# Patient Record
Sex: Female | Born: 1970 | Race: White | Hispanic: No | Marital: Married | Smoking: Current every day smoker
Health system: Southern US, Community
[De-identification: ages and names within clinical notes are randomized; demographics above are authoritative.]

## PROBLEM LIST (undated history)

## (undated) DIAGNOSIS — M5126 Other intervertebral disc displacement, lumbar region: Secondary | ICD-10-CM

## (undated) DIAGNOSIS — F419 Anxiety disorder, unspecified: Secondary | ICD-10-CM

## (undated) DIAGNOSIS — J439 Emphysema, unspecified: Secondary | ICD-10-CM

## (undated) DIAGNOSIS — M5136 Other intervertebral disc degeneration, lumbar region: Secondary | ICD-10-CM

## (undated) DIAGNOSIS — M51369 Other intervertebral disc degeneration, lumbar region without mention of lumbar back pain or lower extremity pain: Secondary | ICD-10-CM

## (undated) DIAGNOSIS — M1990 Unspecified osteoarthritis, unspecified site: Secondary | ICD-10-CM

## (undated) DIAGNOSIS — M797 Fibromyalgia: Secondary | ICD-10-CM

## (undated) DIAGNOSIS — M810 Age-related osteoporosis without current pathological fracture: Secondary | ICD-10-CM

## (undated) HISTORY — PX: ABDOMINAL HYSTERECTOMY: SHX81

---

## 2003-11-05 ENCOUNTER — Other Ambulatory Visit: Payer: Self-pay

## 2004-03-15 ENCOUNTER — Emergency Department: Payer: Self-pay | Admitting: Emergency Medicine

## 2004-06-30 ENCOUNTER — Other Ambulatory Visit: Payer: Self-pay

## 2004-06-30 ENCOUNTER — Emergency Department: Payer: Self-pay | Admitting: Unknown Physician Specialty

## 2005-02-16 ENCOUNTER — Emergency Department: Payer: Self-pay | Admitting: Emergency Medicine

## 2006-03-12 ENCOUNTER — Other Ambulatory Visit: Payer: Self-pay

## 2006-03-12 ENCOUNTER — Emergency Department: Payer: Self-pay | Admitting: Emergency Medicine

## 2006-07-13 ENCOUNTER — Other Ambulatory Visit: Payer: Self-pay

## 2006-07-13 ENCOUNTER — Emergency Department: Payer: Self-pay | Admitting: Emergency Medicine

## 2006-07-17 ENCOUNTER — Emergency Department: Payer: Self-pay | Admitting: Unknown Physician Specialty

## 2006-07-17 ENCOUNTER — Other Ambulatory Visit: Payer: Self-pay

## 2007-03-06 ENCOUNTER — Emergency Department: Payer: Self-pay | Admitting: Unknown Physician Specialty

## 2007-04-13 ENCOUNTER — Emergency Department: Payer: Self-pay | Admitting: Emergency Medicine

## 2007-12-20 ENCOUNTER — Other Ambulatory Visit: Payer: Self-pay

## 2007-12-20 ENCOUNTER — Emergency Department: Payer: Self-pay | Admitting: Internal Medicine

## 2010-09-15 ENCOUNTER — Emergency Department: Payer: Self-pay | Admitting: Emergency Medicine

## 2012-07-07 ENCOUNTER — Emergency Department: Payer: Self-pay | Admitting: Emergency Medicine

## 2012-07-07 LAB — CBC WITH DIFFERENTIAL/PLATELET
Basophil #: 0.1 10*3/uL (ref 0.0–0.1)
Eosinophil #: 0.1 10*3/uL (ref 0.0–0.7)
Lymphocyte %: 31.8 %
MCH: 30.6 pg (ref 26.0–34.0)
MCV: 90 fL (ref 80–100)
Monocyte %: 5.7 %
Neutrophil #: 3.9 10*3/uL (ref 1.4–6.5)
Neutrophil %: 59.9 %
Platelet: 187 10*3/uL (ref 150–440)
RBC: 4.46 10*6/uL (ref 3.80–5.20)
RDW: 14.3 % (ref 11.5–14.5)
WBC: 6.4 10*3/uL (ref 3.6–11.0)

## 2013-02-16 ENCOUNTER — Emergency Department: Payer: Self-pay | Admitting: Internal Medicine

## 2013-02-18 ENCOUNTER — Emergency Department: Payer: Self-pay | Admitting: Emergency Medicine

## 2013-05-28 ENCOUNTER — Emergency Department: Payer: Self-pay | Admitting: Emergency Medicine

## 2013-05-28 LAB — COMPREHENSIVE METABOLIC PANEL
Albumin: 3.7 g/dL (ref 3.4–5.0)
Alkaline Phosphatase: 176 U/L — ABNORMAL HIGH
Anion Gap: 7 (ref 7–16)
BUN: 9 mg/dL (ref 7–18)
Bilirubin,Total: 0.3 mg/dL (ref 0.2–1.0)
CHLORIDE: 106 mmol/L (ref 98–107)
CO2: 26 mmol/L (ref 21–32)
Calcium, Total: 9 mg/dL (ref 8.5–10.1)
Creatinine: 0.82 mg/dL (ref 0.60–1.30)
EGFR (African American): >60
GLUCOSE: 120 mg/dL — AB (ref 65–99)
Osmolality: 277 (ref 275–301)
POTASSIUM: 3.6 mmol/L (ref 3.5–5.1)
SGOT(AST): 69 U/L — ABNORMAL HIGH (ref 15–37)
SGPT (ALT): 97 U/L — ABNORMAL HIGH (ref 12–78)
Sodium: 139 mmol/L (ref 136–145)
TOTAL PROTEIN: 7.4 g/dL (ref 6.4–8.2)

## 2013-05-28 LAB — TROPONIN I: Troponin-I: <0.02 ng/mL

## 2013-05-28 LAB — CBC
HCT: 38.2 % (ref 35.0–47.0)
HGB: 13 g/dL (ref 12.0–16.0)
MCH: 31.9 pg (ref 26.0–34.0)
MCHC: 34.1 g/dL (ref 32.0–36.0)
MCV: 94 fL (ref 80–100)
PLATELETS: 172 10*3/uL (ref 150–440)
RBC: 4.09 10*6/uL (ref 3.80–5.20)
RDW: 13.6 % (ref 11.5–14.5)
WBC: 6.5 10*3/uL (ref 3.6–11.0)

## 2015-01-01 ENCOUNTER — Encounter: Payer: Self-pay | Admitting: Emergency Medicine

## 2015-01-01 DIAGNOSIS — Z720 Tobacco use: Secondary | ICD-10-CM | POA: Diagnosis not present

## 2015-01-01 DIAGNOSIS — Y848 Other medical procedures as the cause of abnormal reaction of the patient, or of later complication, without mention of misadventure at the time of the procedure: Secondary | ICD-10-CM | POA: Insufficient documentation

## 2015-01-01 DIAGNOSIS — M79601 Pain in right arm: Secondary | ICD-10-CM | POA: Diagnosis present

## 2015-01-01 DIAGNOSIS — L7622 Postprocedural hemorrhage and hematoma of skin and subcutaneous tissue following other procedure: Secondary | ICD-10-CM | POA: Insufficient documentation

## 2015-01-01 NOTE — ED Notes (Signed)
Pt presents to ED after she was discharged from [facility] in [place] today at 11am. Pt states while she was in the hospital they gave her iv phenergan that was very painful while being infused X2 during the night. This morning the next shift nurse told the pt that the IV "needs to come out" and was no longer in the vein. IV was removed shortly after that. Pt states her right arm has remained painful and this evening states it appeared to be bruising along her upper arm. No obvious swelling or redness noted to affected arm at this time. Pt slightly anxious and emotional.

## 2015-01-02 ENCOUNTER — Emergency Department
Admission: EM | Admit: 2015-01-02 | Discharge: 2015-01-02 | Disposition: A | Discharge status: Home / Self Care | Payer: Medicare Other | Attending: Emergency Medicine | Admitting: Emergency Medicine

## 2015-01-02 DIAGNOSIS — R58 Hemorrhage, not elsewhere classified: Secondary | ICD-10-CM

## 2015-01-02 NOTE — Discharge Instructions (Signed)
Please seek medical attention for any high fevers, chest pain, shortness of breath, change in behavior, persistent vomiting, bloody stool or any other new or concerning symptoms.  RICE: Routine Care for Injuries The routine care of many injuries includes Rest, Ice, Compression, and Elevation (RICE). HOME CARE INSTRUCTIONS  Rest is needed to allow your body to heal. Routine activities can usually be resumed when comfortable.  Ice following an injury helps keep the swelling down and reduces pain.  Put ice in a plastic bag.  Place a towel between your skin and the bag.  Leave the ice on for 15-20 minutes, 3-4 times a day, or as directed by your health care provider. Do this while awake, for the first 24 to 48 hours. After that, continue as directed by your caregiver.  Compression helps keep swelling down. It also gives support and helps with discomfort. If an elastic bandage has been applied, it should be removed and reapplied every 3 to 4 hours. It should not be applied tightly, but firmly enough to keep swelling down. Watch fingers or toes for swelling, bluish discoloration, coldness, numbness, or excessive pain. If any of these problems occur, remove the bandage and reapply loosely. Contact your caregiver if these problems continue.  Elevation helps reduce swelling and decreases pain. With extremities, such as the arms, hands, legs, and feet, the injured area should be placed near or above the level of the heart, if possible. SEEK IMMEDIATE MEDICAL CARE IF:  You have persistent pain and swelling.  You develop redness, numbness, or unexpected weakness.  Your symptoms are getting worse rather than improving after several days. These symptoms may indicate that further evaluation or further X-rays are needed. Sometimes, X-rays may not show a small broken bone (fracture) until 1 week or 10 days later. Make a follow-up appointment with your caregiver. Ask when your X-ray results will be ready.  Make sure you get your X-ray results. Document Released: 07/24/2000 Document Revised: 04/16/2013 Document Reviewed: 09/10/2010 [facility] Patient Information 2015 [place], [place]. This information is not intended to replace advice given to you by your health care provider. Make sure you discuss any questions you have with your health care provider.

## 2015-01-02 NOTE — ED Provider Notes (Signed)
[facility] Emergency Department Provider Note    ____________________________________________  Time seen: 79  I have reviewed the triage vital signs and the nursing notes.   HISTORY  Chief Complaint Arm Pain   History limited by: Not Limited   HPI Tracie Mcdaniel is a 44 y.o. female who presents to the emergency department today with concerns for right arm pain. Patient states that she had an IV placed 2 days ago at [facility]. She states that she received 2 doses of IV Phenergan with that IV. She did have a concern at the time of administration that it was not in the vein. She states that she felt swelling and bubbling at the site of the IV when she was getting her medications. She stated that one of the nurses then told her it was not in and pulled it out. She states that she has noticed some bruising, swelling and pain to that area since then.     History reviewed. No pertinent past medical history.  There are no active problems to display for this patient.   History reviewed. No pertinent past surgical history.  No current outpatient prescriptions on file.  Allergies Review of patient's allergies indicates no known allergies.  History reviewed. No pertinent family history.  Social History Social History  Substance Use Topics  . Smoking status: Current Every Day Smoker -- 0.25 packs/day    Types: Cigarettes  . Smokeless tobacco: None  . Alcohol Use: No    Review of Systems  Constitutional: Negative for fever. Cardiovascular: Negative for chest pain. Respiratory: Negative for shortness of breath. Gastrointestinal: Negative for abdominal pain, vomiting and diarrhea. Genitourinary: Negative for dysuria. Musculoskeletal: Negative for back pain. Skin: Bruising to the right AC fossa Neurological: Negative for headaches, focal weakness or numbness.  10-point ROS otherwise  negative.  ____________________________________________   PHYSICAL EXAM:  VITAL SIGNS: ED Triage Vitals  Enc Vitals Group     BP 01/01/15 2256 130/87 mmHg     Pulse Rate 01/01/15 2256 85     Resp 01/01/15 2256 20     Temp 01/01/15 2256 98.3 F (36.8 C)     Temp Source 01/01/15 2256 Oral     SpO2 01/01/15 2256 97 %     Weight 01/01/15 2256 128 lb (58.06 kg)     Height 01/01/15 2256  (1.626 m)     Head Cir --      Peak Flow --      Pain Score 01/01/15 2259 8   Constitutional: Alert and oriented. Well appearing and in no distress. Eyes: Conjunctivae are normal. PERRL. Normal extraocular movements. ENT   Head: Normocephalic and atraumatic.   Nose: No congestion/rhinnorhea.   Mouth/Throat: Mucous membranes are moist.   Neck: No stridor. Hematological/Lymphatic/Immunilogical: No cervical lymphadenopathy. Cardiovascular: Normal rate, regular rhythm.  No murmurs, rubs, or gallops. Respiratory: Normal respiratory effort without tachypnea nor retractions. Breath sounds are clear and equal bilaterally. No wheezes/rales/rhonchi. Gastrointestinal: Soft and nontender. No distention.  Genitourinary: Deferred Musculoskeletal: Normal range of motion in all extremities. No joint effusions.  No lower extremity tenderness nor edema. Neurologic:  Normal speech and language. No gross focal neurologic deficits are appreciated. Speech is normal.  Skin:  Skin is warm, dry and intact. Small area of ecchymosis to the right antecubital fossa. Compartments of the upper and lower arm are soft. Psychiatric: Mood and affect are normal. Speech and behavior are normal. Patient exhibits appropriate insight and judgment.  ____________________________________________  LABS (pertinent positives/negatives)  None  ____________________________________________   EKG  None  ____________________________________________     RADIOLOGY  None  ____________________________________________   PROCEDURES  Procedure(s) performed: None  Critical Care performed: No  ____________________________________________   INITIAL IMPRESSION / ASSESSMENT AND PLAN / ED COURSE  Pertinent labs & imaging results that were available during my care of the patient were reviewed by me and considered in my medical decision making (see chart for details).  Patient presents to the emergency department today with concerns for right arm pain and swelling at site of recent IV. On exam all compartments are soft there is some ecchymosis around the area of the IV. Did have a discussion with the patient that even if medication had infiltrated there is nothing that we could do at this point. Did discuss that the compartments were soft into about this is a return precaution.  ____________________________________________   FINAL CLINICAL IMPRESSION(S) / ED DIAGNOSES  Final diagnoses:  Ecchymosis     Phineas Semen, MD 01/02/15 0430

## 2015-01-02 NOTE — ED Notes (Signed)

## 2015-01-02 NOTE — ED Notes (Signed)
Patient present to ED with c/o right upper arm pain, was seen at [facility] and d/c on 01/01/15, reports was given IV phenergan on 12/31/14 and reports pain during administrations of medicine. States "the nurse that replaced her was going to give me phenergan and when he unwrapped it he said 'Oh no this has to come out now.' and he took it out." Bruising noted to IV site on right antecubital and left antecubital . Patient reports tenderness to right bicep. No swelling or redness noted. Patient alert and oriented x 4, respirations even and unlabored, skin warm and dry.

## 2015-08-28 ENCOUNTER — Emergency Department
Admission: EM | Admit: 2015-08-28 | Discharge: 2015-08-28 | Disposition: A | Discharge status: Home / Self Care | Payer: Medicare Other | Attending: Emergency Medicine | Admitting: Emergency Medicine

## 2015-08-28 ENCOUNTER — Encounter: Payer: Self-pay | Admitting: *Deleted

## 2015-08-28 DIAGNOSIS — R21 Rash and other nonspecific skin eruption: Secondary | ICD-10-CM

## 2015-08-28 DIAGNOSIS — F17210 Nicotine dependence, cigarettes, uncomplicated: Secondary | ICD-10-CM | POA: Diagnosis not present

## 2015-08-28 DIAGNOSIS — M1990 Unspecified osteoarthritis, unspecified site: Secondary | ICD-10-CM | POA: Diagnosis not present

## 2015-08-28 HISTORY — DX: Emphysema, unspecified: J43.9

## 2015-08-28 HISTORY — DX: Other intervertebral disc degeneration, lumbar region: M51.36

## 2015-08-28 HISTORY — DX: Other intervertebral disc degeneration, lumbar region without mention of lumbar back pain or lower extremity pain: M51.369

## 2015-08-28 HISTORY — DX: Anxiety disorder, unspecified: F41.9

## 2015-08-28 HISTORY — DX: Unspecified osteoarthritis, unspecified site: M19.90

## 2015-08-28 HISTORY — DX: Other intervertebral disc displacement, lumbar region: M51.26

## 2015-08-28 LAB — CBC WITH DIFFERENTIAL/PLATELET
BASOS PCT: 1 %
Basophils Absolute: 0.1 10*3/uL (ref 0–0.1)
Eosinophils Absolute: 0.1 10*3/uL (ref 0–0.7)
Eosinophils Relative: 2 %
HEMATOCRIT: 38.5 % (ref 35.0–47.0)
HEMOGLOBIN: 13.2 g/dL (ref 12.0–16.0)
LYMPHS ABS: 2.3 10*3/uL (ref 1.0–3.6)
Lymphocytes Relative: 32 %
MCH: 31.6 pg (ref 26.0–34.0)
MCHC: 34.4 g/dL (ref 32.0–36.0)
MCV: 91.8 fL (ref 80.0–100.0)
MONOS PCT: 6 %
Monocytes Absolute: 0.4 10*3/uL (ref 0.2–0.9)
NEUTROS ABS: 4.3 10*3/uL (ref 1.4–6.5)
NEUTROS PCT: 59 %
Platelets: 177 10*3/uL (ref 150–440)
RBC: 4.19 MIL/uL (ref 3.80–5.20)
RDW: 12.9 % (ref 11.5–14.5)
WBC: 7.2 10*3/uL (ref 3.6–11.0)

## 2015-08-28 LAB — COMPREHENSIVE METABOLIC PANEL
ALK PHOS: 86 U/L (ref 38–126)
ALT: 17 U/L (ref 14–54)
ANION GAP: 7 (ref 5–15)
AST: 21 U/L (ref 15–41)
Albumin: 3.9 g/dL (ref 3.5–5.0)
BUN: 11 mg/dL (ref 6–20)
CALCIUM: 8.9 mg/dL (ref 8.9–10.3)
CO2: 24 mmol/L (ref 22–32)
Chloride: 106 mmol/L (ref 101–111)
Creatinine, Ser: 0.86 mg/dL (ref 0.44–1.00)
GFR calc non Af Amer: >60 mL/min (ref >60)
Glucose, Bld: 107 mg/dL — ABNORMAL HIGH (ref 65–99)
POTASSIUM: 3.8 mmol/L (ref 3.5–5.1)
SODIUM: 137 mmol/L (ref 135–145)
TOTAL PROTEIN: 7 g/dL (ref 6.5–8.1)
Total Bilirubin: 0.4 mg/dL (ref 0.3–1.2)

## 2015-08-28 MED ORDER — TRAMADOL HCL 50 MG PO TABS: 50.0000 mg | ORAL_TABLET | Freq: Four times a day (QID) | ORAL | Status: DC | PRN

## 2015-08-28 MED ORDER — TRIAMCINOLONE ACETONIDE 0.5 % EX OINT: 1.0000 "application " | TOPICAL_OINTMENT | Freq: Two times a day (BID) | CUTANEOUS | Status: DC

## 2015-08-28 MED ORDER — TRAMADOL HCL 50 MG PO TABS
50.0000 mg | ORAL_TABLET | Freq: Once | ORAL | Status: AC
  Administered 2015-08-28: 50 mg via ORAL
  Filled 2015-08-28: qty 1

## 2015-08-28 NOTE — ED Notes (Signed)
Pt reports a rash beginning on the inner area of both feet this morning at 1000. Pt reports noticing a knot in the right thigh and is reporting bilateral leg pains beginning this morning. Pt also reports having nausea yesterday but denies vomiting or fevers. Pt reports the rash is not itching. No heat noted to the area. Pt able to walk with no difficulty in triage.

## 2015-08-28 NOTE — ED Notes (Signed)
Pt states she took a [employer].  Patient states she has chronic foot pain and noticed it more yesterday.  Today before she was going tanning she noticed red rash to lower legs and medial feet.  Patient states the pain is increased when ambulating.  Denies any itching.

## 2015-08-28 NOTE — ED Provider Notes (Signed)
[facility] Emergency Department Provider Note  ____________________________________________  Time seen: Approximately 4:28 PM  I have reviewed the triage vital signs and the nursing notes.   HISTORY  Chief Complaint Rash   HPI Tracie Mcdaniel is a 45 y.o. female who presents to the emergency department for evaluation of a rash to bilateral lower extremities. She noticed the rash around 10:00 this morning. She states that she also noticed a "knot" in the right upper thigh that has not been there. It is tender, but not swollen or visible. She states that her feet have also been hurting much worse than usual today. She has been going to the tanning bed daily for the last 3 days, but has not used any new tanning products. She denies ever having a rash similar to this one.   Past Medical History  Diagnosis Date  . Osteoarthritis   . Bulging lumbar disc     x3  . Emphysema of lung (HCC)   . Anxiety     There are no active problems to display for this patient.   Past Surgical History  Procedure Laterality Date  . Abdominal hysterectomy      Current Outpatient Rx  Name  Route  Sig  Dispense  Refill  . traMADol (ULTRAM) 50 MG tablet   Oral   Take 1 tablet (50 mg total) by mouth every 6 (six) hours as needed.   9 tablet   0   . triamcinolone ointment (KENALOG) 0.5 %   Topical   Apply 1 application topically 2 (two) times daily.   30 g   0     Allergies Review of patient's allergies indicates no known allergies.  History reviewed. No pertinent family history.  Social History Social History  Substance Use Topics  . Smoking status: Current Every Day Smoker -- 0.50 packs/day    Types: Cigarettes  . Smokeless tobacco: None  . Alcohol Use: No    Review of Systems  Constitutional: Negative for fever/chills Respiratory: Negative for shortness of breath. Musculoskeletal: Positive for pain. Skin: Positive for rash on bilateral lower  extremities. Neurological: Negative for headaches, focal weakness or numbness. ____________________________________________   PHYSICAL EXAM:  VITAL SIGNS: ED Triage Vitals  Enc Vitals Group     BP 08/28/15 1520 131/80 mmHg     Pulse Rate 08/28/15 1520 90     Resp 08/28/15 1520 18     Temp 08/28/15 1520 98.3 F (36.8 C)     Temp Source 08/28/15 1520 Oral     SpO2 08/28/15 1520 99 %     Weight 08/28/15 1520 130 lb (58.968 kg)     Height 08/28/15 1520 5\' 2"  (1.575 m)     Head Cir --      Peak Flow --      Pain Score 08/28/15 1518 6     Pain Loc --      Pain Edu? --      Excl. in GC? --      Constitutional: Alert and oriented. Well appearing and in no acute distress. Eyes: Conjunctivae are normal. PERRL. EOMI. Nose: No congestion/rhinnorhea. Mouth/Throat: Mucous membranes are moist.   Neck: No stridor. Lymphatic: No cervical lymphadenopathy. Mobile nodule palpated in the anterior, medial thigh without associated erythema or fluctuance. Cardiovascular: Good peripheral circulation. Respiratory: Normal respiratory effort.  No retractions.. Musculoskeletal: FROM throughout. Neurologic:  Normal speech and language. No gross focal neurologic deficits are appreciated. Skin:  Confluent, petechial rash noted to bilateral  medial feet and scattered, singular maculopapular rash to bilateral medial calves and just above knees  ____________________________________________   LABS (all labs ordered are listed, but only abnormal results are displayed)  Labs Reviewed  COMPREHENSIVE METABOLIC PANEL - Abnormal; Notable for the following:    Glucose, Bld 107 (*)    All other components within normal limits  CBC WITH DIFFERENTIAL/PLATELET   ____________________________________________  EKG   ____________________________________________  RADIOLOGY  Not indicated. ____________________________________________   PROCEDURES  Procedure(s) performed:   ____________________________________________   INITIAL IMPRESSION / ASSESSMENT AND PLAN / ED COURSE  Pertinent labs & imaging results that were available during my care of the patient were reviewed by me and considered in my medical decision making (see chart for details).  Patient will be advised to use the triamcinolone twice per day.  She was advised to follow up with dermatology for symptoms that do not improve over the week. She was advised to avoid the tanning bed until the rash has cleared. She was also advised to return to the emergency department for symptoms that change or worsen if unable to schedule an appointment.   ____________________________________________   FINAL CLINICAL IMPRESSION(S) / ED DIAGNOSES  Final diagnoses:  Rash and nonspecific skin eruption       Chinita Pester, FNP 08/28/15 1724  Minna Antis, MD 08/28/15 2004

## 2015-08-31 ENCOUNTER — Emergency Department: Payer: Medicare Other

## 2015-08-31 ENCOUNTER — Emergency Department
Admission: EM | Admit: 2015-08-31 | Discharge: 2015-08-31 | Disposition: A | Discharge status: Home / Self Care | Payer: Medicare Other | Attending: Emergency Medicine | Admitting: Emergency Medicine

## 2015-08-31 ENCOUNTER — Encounter: Payer: Self-pay | Admitting: Emergency Medicine

## 2015-08-31 DIAGNOSIS — R0789 Other chest pain: Secondary | ICD-10-CM | POA: Diagnosis not present

## 2015-08-31 DIAGNOSIS — F17210 Nicotine dependence, cigarettes, uncomplicated: Secondary | ICD-10-CM | POA: Insufficient documentation

## 2015-08-31 DIAGNOSIS — Z79899 Other long term (current) drug therapy: Secondary | ICD-10-CM | POA: Diagnosis not present

## 2015-08-31 DIAGNOSIS — R079 Chest pain, unspecified: Secondary | ICD-10-CM

## 2015-08-31 LAB — CBC WITH DIFFERENTIAL/PLATELET
Basophils Absolute: 0.1 10*3/uL (ref 0–0.1)
Basophils Relative: 1 %
EOS ABS: 0.1 10*3/uL (ref 0–0.7)
HCT: 41.5 % (ref 35.0–47.0)
Hemoglobin: 14.4 g/dL (ref 12.0–16.0)
LYMPHS ABS: 2.2 10*3/uL (ref 1.0–3.6)
Lymphocytes Relative: 25 %
MCH: 31.7 pg (ref 26.0–34.0)
MCHC: 34.8 g/dL (ref 32.0–36.0)
MCV: 90.9 fL (ref 80.0–100.0)
MONO ABS: 0.4 10*3/uL (ref 0.2–0.9)
Neutro Abs: 6.3 10*3/uL (ref 1.4–6.5)
Neutrophils Relative %: 68 %
PLATELETS: 185 10*3/uL (ref 150–440)
RBC: 4.56 MIL/uL (ref 3.80–5.20)
RDW: 12.9 % (ref 11.5–14.5)
WBC: 9.1 10*3/uL (ref 3.6–11.0)

## 2015-08-31 LAB — BASIC METABOLIC PANEL
Anion gap: 12 (ref 5–15)
BUN: 12 mg/dL (ref 6–20)
CALCIUM: 9.1 mg/dL (ref 8.9–10.3)
CO2: 21 mmol/L — AB (ref 22–32)
CREATININE: 0.78 mg/dL (ref 0.44–1.00)
Chloride: 106 mmol/L (ref 101–111)
GFR calc non Af Amer: >60 mL/min (ref >60)
GLUCOSE: 103 mg/dL — AB (ref 65–99)
Potassium: 3.8 mmol/L (ref 3.5–5.1)
Sodium: 139 mmol/L (ref 135–145)

## 2015-08-31 LAB — TROPONIN I: Troponin I: <0.03 ng/mL (ref <0.031)

## 2015-08-31 MED ORDER — ONDANSETRON HCL 4 MG/2ML IJ SOLN
4.0000 mg | Freq: Once | INTRAMUSCULAR | Status: AC
  Administered 2015-08-31: 4 mg via INTRAVENOUS
  Filled 2015-08-31: qty 2

## 2015-08-31 MED ORDER — ASPIRIN 81 MG PO CHEW
324.0000 mg | CHEWABLE_TABLET | Freq: Once | ORAL | Status: AC
  Administered 2015-08-31: 324 mg via ORAL
  Filled 2015-08-31: qty 4

## 2015-08-31 MED ORDER — MORPHINE SULFATE (PF) 4 MG/ML IV SOLN
INTRAVENOUS | Status: DC
Start: 2015-08-31 — End: 2015-08-31
  Filled 2015-08-31: qty 1

## 2015-08-31 MED ORDER — LORAZEPAM 2 MG/ML IJ SOLN
1.0000 mg | Freq: Once | INTRAMUSCULAR | Status: AC
  Administered 2015-08-31: 1 mg via INTRAVENOUS
  Filled 2015-08-31: qty 1

## 2015-08-31 MED ORDER — IOPAMIDOL (ISOVUE-370) INJECTION 76%
75.0000 mL | Freq: Once | INTRAVENOUS | Status: AC | PRN
  Administered 2015-08-31: 75 mL via INTRAVENOUS

## 2015-08-31 MED ORDER — MORPHINE SULFATE (PF) 4 MG/ML IV SOLN
4.0000 mg | Freq: Once | INTRAVENOUS | Status: AC
  Administered 2015-08-31: 4 mg via INTRAVENOUS

## 2015-08-31 MED ORDER — SODIUM CHLORIDE 0.9 % IV BOLUS (SEPSIS)
1000.0000 mL | Freq: Once | INTRAVENOUS | Status: AC
  Administered 2015-08-31: 1000 mL via INTRAVENOUS

## 2015-08-31 MED ORDER — NITROGLYCERIN 0.4 MG SL SUBL
0.4000 mg | SUBLINGUAL_TABLET | SUBLINGUAL | Status: DC | PRN
  Administered 2015-08-31: 0.4 mg via SUBLINGUAL
  Filled 2015-08-31: qty 1

## 2015-08-31 MED ORDER — MORPHINE SULFATE (PF) 4 MG/ML IV SOLN
4.0000 mg | Freq: Once | INTRAVENOUS | Status: AC
  Administered 2015-08-31: 4 mg via INTRAVENOUS
  Filled 2015-08-31: qty 1

## 2015-08-31 NOTE — ED Notes (Signed)
Pt presents with chest pain since this morning with increased sob and dizziness, nausea. Pt denies prior chest pain. Describes pain as pressure, with sharp pains to left arm

## 2015-08-31 NOTE — ED Notes (Signed)
Pt in hallway in crying. Pt disconnected self from monitor and oxygen. Explained delay to pt and informed her that Dr. Shaune PollackLord will be in to talk with her as soon as possible.

## 2015-08-31 NOTE — Discharge Instructions (Signed)
You were evaluated for chest pain, and although no certain cause was found, your exam and evaluation are reassuring in the emergency department today.  We discussed, follow-up with cardiologist, call today or tomorrow to make an appointment in the next 1-2 days.  Return to the emergency department for any new or worsening condition including new or worsening chest pain, sweats, vomiting blood, black or bloody stools, dizziness, passing out, trouble breathing, or any other symptoms concerning to you.   Nonspecific Chest Pain It is often hard to find the cause of chest pain. There is always a chance that your pain could be related to something serious, such as a heart attack or a blood clot in your lungs. Chest pain can also be caused by conditions that are not life-threatening. If you have chest pain, it is very important to follow up with your doctor.  HOME CARE  If you were prescribed an antibiotic medicine, finish it all even if you start to feel better.  Avoid any activities that cause chest pain.  Do not use any tobacco products, including cigarettes, chewing tobacco, or electronic cigarettes. If you need help quitting, ask your doctor.  Do not drink alcohol.  Take medicines only as told by your doctor.  Keep all follow-up visits as told by your doctor. This is important. This includes any further testing if your chest pain does not go away.  Your doctor may tell you to keep your head raised (elevated) while you sleep.  Make lifestyle changes as told by your doctor. These may include:  Getting regular exercise. Ask your doctor to suggest some activities that are safe for you.  Eating a heart-healthy diet. Your doctor or a diet specialist (dietitian) can help you to learn healthy eating options.  Maintaining a healthy weight.  Managing diabetes, if necessary.  Reducing stress. GET HELP IF:  Your chest pain does not go away, even after treatment.  You have a rash with  blisters on your chest.  You have a fever. GET HELP RIGHT AWAY IF:  Your chest pain is worse.  You have an increasing cough, or you cough up blood.  You have severe belly (abdominal) pain.  You feel extremely weak.  You pass out (faint).  You have chills.  You have sudden, unexplained chest discomfort.  You have sudden, unexplained discomfort in your arms, back, neck, or jaw.  You have shortness of breath at any time.  You suddenly start to sweat, or your skin gets clammy.  You feel nauseous.  You vomit.  You suddenly feel light-headed or dizzy.  Your heart begins to beat quickly, or it feels like it is skipping beats. These symptoms may be an emergency. Do not wait to see if the symptoms will go away. Get medical help right away. Call your local emergency services (911 in the [place] Do not drive yourself to the hospital.   This information is not intended to replace advice given to you by your health care provider. Make sure you discuss any questions you have with your health care provider.   Document Released: 09/28/2007 Document Revised: 05/02/2014 Document Reviewed: 11/15/2013 [employer] Interactive Patient Education [employer]

## 2015-08-31 NOTE — ED Provider Notes (Addendum)
[facility] Emergency Department Provider Note   ____________________________________________  Time seen: I have reviewed the triage vital signs and the triage nursing note.  HISTORY  Chief Complaint Chest Pain   Historian Patient  HPI Tracie Mcdaniel is a 45 y.o. female with a history of hyperlipidemia, smoking history with COPD/emphysema who wears home 3 L nasal cannula, as well as anxiety, is here for evaluation of central left-sided chest pain. Chest pain started this morning when she woke up around 6 AM. It's been constant with some intermittent excessively sharp left-sided chest pains.  Some radiation to the left arm. Significant anxiety. Mild nausea without vomiting. No abdominal pain, belching, or gastro-symptoms.  Denies any neurologic complaints.  No prior history of heart attack.  Her primary care physician, Dr. Leta Baptist ([place])did call to provide some additional history -- patient has had numerous pain complaints in the past, but no recent cardiac complaints.  Significant hx of VF with shock during cardiac cath in 2013, which did not show significant coronary disease. She does however continue to smoke, and have hyperlipidemia    Past Medical History  Diagnosis Date  . Osteoarthritis   . Bulging lumbar disc     x3  . Emphysema of lung (HCC)   . Anxiety     There are no active problems to display for this patient.   Past Surgical History  Procedure Laterality Date  . Abdominal hysterectomy      Current Outpatient Rx  Name  Route  Sig  Dispense  Refill  . albuterol (PROVENTIL HFA;VENTOLIN HFA) 108 (90 Base) MCG/ACT inhaler   Inhalation   Inhale 2 puffs into the lungs every 6 (six) hours as needed for wheezing or shortness of breath.         Marland Kitchen albuterol (PROVENTIL) (5 MG/ML) 0.5% nebulizer solution   Nebulization   Take 2.5 mg by nebulization every 6 (six) hours as needed for wheezing or shortness of breath.         .  ALPRAZolam (XANAX) 1 MG tablet   Oral   Take 1 mg by mouth every 6 (six) hours as needed for anxiety.         Marland Kitchen amitriptyline (ELAVIL) 100 MG tablet   Oral   Take 100 mg by mouth at bedtime.         . Brexpiprazole (REXULTI) 1 MG TABS   Oral   Take 1 mg by mouth at bedtime.         . butalbital-acetaminophen-caffeine (FIORICET, ESGIC) 50-325-40 MG tablet   Oral   Take 1 tablet by mouth every 6 (six) hours as needed for migraine.         . diclofenac sodium (VOLTAREN) 1 % GEL   Topical   Apply 2 g topically 4 (four) times daily as needed (for pain).         . Fluticasone-Salmeterol (ADVAIR) 500-50 MCG/DOSE AEPB   Inhalation   Inhale 1 puff into the lungs 2 (two) times daily.         Marland Kitchen gabapentin (NEURONTIN) 300 MG capsule   Oral   Take 1,200 mg by mouth 3 (three) times daily.         Marland Kitchen lactulose (CHRONULAC) 10 GM/15ML solution   Oral   Take 10 g by mouth daily.         Marland Kitchen lovastatin (MEVACOR) 20 MG tablet   Oral   Take 20 mg by mouth at bedtime.         Marland Kitchen  omeprazole (PRILOSEC) 20 MG capsule   Oral   Take 20 mg by mouth 2 (two) times daily before a meal.         . oxyCODONE-acetaminophen (PERCOCET/ROXICET) 5-325 MG tablet   Oral   Take 1 tablet by mouth every 4 (four) hours as needed for severe pain.         . promethazine (PHENERGAN) 12.5 MG tablet   Oral   Take 12.5 mg by mouth every 6 (six) hours as needed for nausea or vomiting.         Marland Kitchen. QUEtiapine (SEROQUEL) 400 MG tablet   Oral   Take 400 mg by mouth at bedtime.         . sertraline (ZOLOFT) 100 MG tablet   Oral   Take 250 mg by mouth at bedtime.          Marland Kitchen. tiotropium (SPIRIVA) 18 MCG inhalation capsule   Inhalation   Place 18 mcg into inhaler and inhale daily.         Marland Kitchen. topiramate (TOPAMAX) 50 MG tablet   Oral   Take 150 mg by mouth 2 (two) times daily.         . traMADol (ULTRAM) 50 MG tablet   Oral   Take 50 mg by mouth every 6 (six) hours as needed for moderate  pain.         Marland Kitchen. triamcinolone ointment (KENALOG) 0.5 %   Topical   Apply 1 application topically 2 (two) times daily as needed (for itching).         . zolpidem (AMBIEN) 10 MG tablet   Oral   Take 10 mg by mouth at bedtime as needed for sleep.           Allergies Contrast media  No family history on file.  Social History Social History  Substance Use Topics  . Smoking status: Current Every Day Smoker -- 0.50 packs/day    Types: Cigarettes  . Smokeless tobacco: None  . Alcohol Use: No    Review of Systems  Constitutional: Negative for fever. Eyes: Negative for visual changes. ENT: Negative for sore throat. Cardiovascular: Positive for chest pain. No pleuritic chest pain Respiratory: Negative for shortness of breath. Gastrointestinal: Negative for abdominal pain, vomiting and diarrhea. Genitourinary: Negative for dysuria. Musculoskeletal: Negative for back pain. Skin: Negative for rash. Neurological: Negative for headache. 10 point Review of Systems otherwise negative ____________________________________________   PHYSICAL EXAM:  VITAL SIGNS: ED Triage Vitals  Enc Vitals Group     BP 08/31/15 0903 135/89 mmHg     Pulse Rate 08/31/15 0903 98     Resp 08/31/15 0903 20     Temp 08/31/15 0903 98.3 F (36.8 C)     Temp Source 08/31/15 0903 Oral     SpO2 08/31/15 0903 99 %     Weight 08/31/15 0903 130 lb (58.968 kg)     Height 08/31/15 0903 5\' 4"  (1.626 m)     Head Cir --      Peak Flow --      Pain Score 08/31/15 0903 2     Pain Loc --      Pain Edu? --      Excl. in GC? --      Constitutional: Alert and oriented. She seems highly anxious and having some wincing due to chest pain. HEENT   Head: Normocephalic and atraumatic.      Eyes: Conjunctivae are normal. PERRL. Normal extraocular movements.  Ears:         Nose: No congestion/rhinnorhea.   Mouth/Throat: Mucous membranes are moist.   Neck: No stridor. Cardiovascular/Chest: Normal  rate, regular rhythm.  No murmurs, rubs, or gallops. Respiratory: Normal respiratory effort without tachypnea nor retractions. Breath sounds are clear and equal bilaterally. No wheezes/rales/rhonchi. Gastrointestinal: Soft. No distention, no guarding, no rebound. Nontender.    Genitourinary/rectal:Deferred Musculoskeletal: Nontender with normal range of motion in all extremities. No joint effusions.  No lower extremity tenderness.  No edema. Neurologic:  Normal speech and language. No gross or focal neurologic deficits are appreciated. Skin:  Skin is warm, dry and intact. No rash noted. Psychiatric: Anxious but otherwise speech and behavior are normal.  ____________________________________________   EKG I, Governor Rooks, MD, the attending physician have personally viewed and interpreted all ECGs.  92 bpm. Normal sinus rhythm. Narrow QRS. Normal axis. Normal ST and T-wave ____________________________________________  LABS (pertinent positives/negatives)  Basic metabolic panel without significant abnormality Troponin less than 0.03 CBC within normal limits Repeat troponin less than 0.03  ____________________________________________  RADIOLOGY All Xrays were viewed by me. Imaging interpreted by Radiologist.  Chest two-view: Slight scarring left base. No edema or consolidation.  CT chest with contrast:  IMPRESSION: 1. Negative for acute PE or thoracic aortic dissection. 2. Emphysema, with progressive linear scarring or atelectasis posteriorly in both lower lobes. __________________________________________  PROCEDURES  Procedure(s) performed: None  Critical Care performed: None  ____________________________________________   ED COURSE / ASSESSMENT AND PLAN  Pertinent labs & imaging results that were available during my care of the patient were reviewed by me and considered in my medical decision making (see chart for details).   This patient is complaining of  left-sided chest pain which extends up towards her left arm, and is associated with some nausea but without palpitations, vomiting, abdominal pain, or any respiratory symptoms. Nonpleuritic and I'm not suspicious of PE. She is not hypoxic to her baseline home 3 L nasal cannula.  Her EKG is reassuring. Her laboratory studies are reassuring. Her chest pain has been ongoing reportedly since 6 AM, 3 hours prior to blood draw. I am going to send a 3 hour troponin to check for any changes.   Patient is still tearful and complaining of severe pain. She appears very anxious was given a dose of Ativan.  Patient did say that she calmed down somewhat with that, but she still complaining of severe pain. I discussed with her that her symptoms don't sound like pulmonary embolus, without any respiratory symptoms. She is complaining of pain that is severe and through to her back. I discussed the possibility of vascular emergency with her and although she is not complaining of neurologic complication or symptoms, and normal little concerned about excessive CTs in this patient who reportedly does have a history of prior CTs, I discussed risks versus benefit with patient and she states that this pain is very different than the pain she was having in December when she had chest CT for PE and wants to proceed with chest CT angiogram to rule out vascular/aortic emergency.  Alternatively, if this is negative, I am grabbed patient follow up as an outpatient with cardiology. I discussed this with Dr. Darrold Junker, who will see the patient follow up. Patient is to call negative point within the next 1-2 days.  After this discussion with the patient she did seem much more calm and comfortable with the plan that if the CT is negative given everything else has been reassuring  that she could be discharged home for follow-up with the cardiologist. We did discuss return depressions.  Patient care transferred to Dr. Scotty Court at shift  change 3:30 PM. Chest CT angiogram is pending. If this is negative, patient may be discharged with my prepared instructions and follow-up plan.  In terms of the computer IV PE dye allergy, she had a chest CT with contrast without any premedication in December, and therefore I don't think that she has an IV dye allergy.  ----------------------------------------- 4:13 PM on 08/31/2015 -----------------------------------------  I was able to review the CT which is reassuring. Patient will be discharged home.  CONSULTATIONS:   Dr. Darrold Junker, cardiology by phone.   Patient / Family / Caregiver informed of clinical course, medical decision-making process, and agree with plan.   I discussed return precautions, follow-up instructions, and discharged instructions with patient and/or family.   ___________________________________________   FINAL CLINICAL IMPRESSION(S) / ED DIAGNOSES   Final diagnoses:  Chest pain, unspecified chest pain type              Note: This dictation was prepared with Dragon dictation. Any transcriptional errors that result from this process are unintentional   Governor Rooks, MD 08/31/15 1513  Governor Rooks, MD 08/31/15 [phone]

## 2017-04-17 ENCOUNTER — Emergency Department: Payer: Medicare Other

## 2017-04-17 ENCOUNTER — Other Ambulatory Visit: Payer: Self-pay

## 2017-04-17 ENCOUNTER — Emergency Department
Admission: EM | Admit: 2017-04-17 | Discharge: 2017-04-17 | Disposition: A | Discharge status: Home / Self Care | Payer: Medicare Other | Attending: Emergency Medicine | Admitting: Emergency Medicine

## 2017-04-17 ENCOUNTER — Encounter: Payer: Self-pay | Admitting: Emergency Medicine

## 2017-04-17 DIAGNOSIS — R200 Anesthesia of skin: Secondary | ICD-10-CM | POA: Diagnosis present

## 2017-04-17 DIAGNOSIS — J441 Chronic obstructive pulmonary disease with (acute) exacerbation: Secondary | ICD-10-CM

## 2017-04-17 DIAGNOSIS — F17210 Nicotine dependence, cigarettes, uncomplicated: Secondary | ICD-10-CM | POA: Diagnosis not present

## 2017-04-17 DIAGNOSIS — Z79899 Other long term (current) drug therapy: Secondary | ICD-10-CM | POA: Diagnosis not present

## 2017-04-17 DIAGNOSIS — M5412 Radiculopathy, cervical region: Secondary | ICD-10-CM | POA: Insufficient documentation

## 2017-04-17 LAB — BASIC METABOLIC PANEL
Anion gap: 5 (ref 5–15)
BUN: 8 mg/dL (ref 6–20)
CHLORIDE: 108 mmol/L (ref 101–111)
CO2: 23 mmol/L (ref 22–32)
CREATININE: 0.81 mg/dL (ref 0.44–1.00)
Calcium: 8.5 mg/dL — ABNORMAL LOW (ref 8.9–10.3)
GFR calc Af Amer: >60 mL/min (ref >60)
GFR calc non Af Amer: >60 mL/min (ref >60)
GLUCOSE: 124 mg/dL — AB (ref 65–99)
Potassium: 4 mmol/L (ref 3.5–5.1)
SODIUM: 136 mmol/L (ref 135–145)

## 2017-04-17 LAB — CBC WITH DIFFERENTIAL/PLATELET
Basophils Absolute: 0 10*3/uL (ref 0–0.1)
Basophils Relative: 1 %
EOS ABS: 0 10*3/uL (ref 0–0.7)
Eosinophils Relative: 1 %
HCT: 40.1 % (ref 35.0–47.0)
HEMOGLOBIN: 13.8 g/dL (ref 12.0–16.0)
LYMPHS ABS: 0.8 10*3/uL — AB (ref 1.0–3.6)
LYMPHS PCT: 17 %
MCH: 32.6 pg (ref 26.0–34.0)
MCHC: 34.4 g/dL (ref 32.0–36.0)
MCV: 94.6 fL (ref 80.0–100.0)
MONOS PCT: 2 %
Monocytes Absolute: 0.1 10*3/uL — ABNORMAL LOW (ref 0.2–0.9)
NEUTROS PCT: 79 %
Neutro Abs: 3.9 10*3/uL (ref 1.4–6.5)
Platelets: 233 10*3/uL (ref 150–440)
RBC: 4.24 MIL/uL (ref 3.80–5.20)
RDW: 12.9 % (ref 11.5–14.5)
WBC: 4.9 10*3/uL (ref 3.6–11.0)

## 2017-04-17 MED ORDER — IPRATROPIUM-ALBUTEROL 0.5-2.5 (3) MG/3ML IN SOLN
RESPIRATORY_TRACT | Status: AC
  Administered 2017-04-17: 3 mL via RESPIRATORY_TRACT
  Filled 2017-04-17: qty 3

## 2017-04-17 MED ORDER — PREDNISONE 20 MG PO TABS: 40.0000 mg | ORAL_TABLET | Freq: Every day | ORAL | 0 refills | Status: DC

## 2017-04-17 MED ORDER — AZITHROMYCIN 500 MG PO TABS
500.0000 mg | ORAL_TABLET | Freq: Once | ORAL | Status: AC
  Administered 2017-04-17: 500 mg via ORAL
  Filled 2017-04-17: qty 1

## 2017-04-17 MED ORDER — ALBUTEROL SULFATE (2.5 MG/3ML) 0.083% IN NEBU
7.5000 mg | INHALATION_SOLUTION | Freq: Once | RESPIRATORY_TRACT | Status: AC
  Administered 2017-04-17: 7.5 mg via RESPIRATORY_TRACT
  Filled 2017-04-17: qty 9

## 2017-04-17 MED ORDER — AZITHROMYCIN 250 MG PO TABS: ORAL_TABLET | ORAL | 0 refills | Status: DC

## 2017-04-17 MED ORDER — IPRATROPIUM-ALBUTEROL 0.5-2.5 (3) MG/3ML IN SOLN
3.0000 mL | Freq: Once | RESPIRATORY_TRACT | Status: AC
  Administered 2017-04-17: 3 mL via RESPIRATORY_TRACT
  Filled 2017-04-17: qty 3

## 2017-04-17 MED ORDER — PREDNISONE 20 MG PO TABS
60.0000 mg | ORAL_TABLET | Freq: Once | ORAL | Status: AC
  Administered 2017-04-17: 60 mg via ORAL
  Filled 2017-04-17: qty 3

## 2017-04-17 MED ORDER — KETOROLAC TROMETHAMINE 30 MG/ML IJ SOLN: 30.0000 mg | Freq: Once | INTRAMUSCULAR | Status: DC

## 2017-04-17 MED ORDER — OXYCODONE-ACETAMINOPHEN 5-325 MG PO TABS
ORAL_TABLET | ORAL | Status: AC
  Administered 2017-04-17: 1 via ORAL
  Filled 2017-04-17: qty 1

## 2017-04-17 MED ORDER — OXYCODONE-ACETAMINOPHEN 5-325 MG PO TABS
1.0000 | ORAL_TABLET | Freq: Once | ORAL | Status: AC
  Administered 2017-04-17: 1 via ORAL
  Filled 2017-04-17: qty 1

## 2017-04-17 MED ORDER — OXYCODONE-ACETAMINOPHEN 5-325 MG PO TABS
1.0000 | ORAL_TABLET | Freq: Once | ORAL | Status: AC
  Administered 2017-04-17: 1 via ORAL

## 2017-04-17 NOTE — ED Triage Notes (Signed)
Pt with neck pain and right arm numbness started yesterday morning before church. Pt wears 3liters of oxygen cont.

## 2017-04-17 NOTE — ED Triage Notes (Signed)
FIRST NURSE NOTE-started with right arm numbness when woke yesterday AM.  Also c/o headache and neck pain. Ambulatory to desk without assistance.

## 2017-04-17 NOTE — ED Provider Notes (Signed)
Patient ambulatory, reports her breathing is improved.  She is currently breathing comfortably with normal rate and oxygen saturation on her baseline 3 L.  Her lung sounds are improved with only minimalend expiratory wheezing and she speaks in full clear sentences.  She appears much improved reports her neck pain is minimal at this time, improved from presentation.  She is able to ambulate without difficulty around the ER and her trending pulse ox was normal after ambulation.  Patient comfortable with a plan for follow-up, home treatment with careful return precautions regarding her COPD and also careful return precautions and a plan for neurosurgery follow-up regarding her neck pain.  Discharged home. Return precautions and treatment recommendations and follow-up discussed with the patient who is agreeable with the plan.    Sharyn CreamerQuale, Oluwanifemi Susman, MD 04/17/17 [phone]

## 2017-04-17 NOTE — ED Notes (Signed)
Pt remained at 97% O2 sat while ambulating on 3L

## 2017-04-17 NOTE — ED Notes (Signed)
Topez not working 

## 2017-04-17 NOTE — ED Provider Notes (Signed)
Spoke with Dr. Whitney PostStephen Cook of neurosurgery.  Reviewed MRI findings and clinical history/exam.  He recommends brief steroid treatment, for which the patient is on prednisone.  And he will have his clinic follow-up with her on Wednesday to set her up for outpatient management and follow-up with his clinic.    Sharyn CreamerQuale, Mark, MD 04/17/17 [phone]

## 2017-04-17 NOTE — ED Provider Notes (Signed)
[facility] Emergency Department Provider Note  ____________________________________________   First MD Initiated Contact with Patient 04/17/17 1106     (approximate)  I have reviewed the triage vital signs and the nursing notes.   HISTORY  Chief Complaint Numbness (neck and right arm)   HPI Tracie Mcdaniel is a 46 y.o. female with a history of bulging lumbar disks as well as emphysema on home oxygen who is presenting to the emergency department today with right arm numbness as well as neck pain that began yesterday morning.  She says that her arm felt asleep yesterday morning and cold.  However, she woke up this morning with severe neck pain and presented to the emergency department because of this.  Says that her breathing has also been labored and she has had worsening shortness of breath but denies any chest pain.  Past Medical History:  Diagnosis Date  . Anxiety   . Bulging lumbar disc    x3  . Emphysema of lung (HCC)   . Osteoarthritis     There are no active problems to display for this patient.   Past Surgical History:  Procedure Laterality Date  . ABDOMINAL HYSTERECTOMY      Prior to Admission medications   Medication Sig Start Date End Date Taking? Authorizing Provider  acetaminophen-codeine (TYLENOL #2) 300-15 MG tablet Take 1 tablet by mouth every 4 (four) hours as needed for moderate pain.   Yes [provider]  amitriptyline (ELAVIL) 100 MG tablet Take 100 mg by mouth at bedtime.   Yes [provider]  Fluticasone-Salmeterol (ADVAIR) 500-50 MCG/DOSE AEPB Inhale 1 puff into the lungs 2 (two) times daily.   Yes [provider]  gabapentin (NEURONTIN) 300 MG capsule Take 1,200 mg by mouth 3 (three) times daily.   Yes [provider]  omeprazole (PRILOSEC) 20 MG capsule Take 20 mg by mouth 2 (two) times daily before a meal.   Yes [provider]  sertraline (ZOLOFT) 100 MG tablet Take 250 mg by  mouth daily.    Yes [provider]  tiotropium (SPIRIVA) 18 MCG inhalation capsule Place 18 mcg into inhaler and inhale daily.   Yes [provider]  topiramate (TOPAMAX) 50 MG tablet Take 150 mg by mouth 2 (two) times daily.   Yes [provider]  albuterol (PROVENTIL HFA;VENTOLIN HFA) 108 (90 Base) MCG/ACT inhaler Inhale 2 puffs into the lungs every 6 (six) hours as needed for wheezing or shortness of breath.    [provider]  albuterol (PROVENTIL) (5 MG/ML) 0.5% nebulizer solution Take 2.5 mg by nebulization every 6 (six) hours as needed for wheezing or shortness of breath.    [provider]  ALPRAZolam Prudy Feeler(XANAX) 1 MG tablet Take 1 mg by mouth every 6 (six) hours as needed for anxiety.    [provider]  butalbital-acetaminophen-caffeine (FIORICET, ESGIC) 50-325-40 MG tablet Take 1 tablet by mouth every 6 (six) hours as needed for migraine.    [provider]  diclofenac sodium (VOLTAREN) 1 % GEL Apply 2 g topically 4 (four) times daily as needed (for pain).    [provider]  lactulose (CHRONULAC) 10 GM/15ML solution Take 10 g by mouth daily as needed.     [provider]  promethazine (PHENERGAN) 12.5 MG tablet Take 12.5 mg by mouth every 6 (six) hours as needed for nausea or vomiting.    [provider]  triamcinolone ointment (KENALOG) 0.5 % Apply 1 application topically  2 (two) times daily as needed (for itching).    [provider]  zolpidem (AMBIEN) 10 MG tablet Take 10 mg by mouth at bedtime as needed for sleep.    [provider]    Allergies Patient has no known allergies.  No family history on file.  Social History Social History   Tobacco Use  . Smoking status: Current Every Day Smoker    Packs/day: 0.50    Types: Cigarettes  . Smokeless tobacco: Never Used  Substance Use Topics  . Alcohol use: No  . Drug use: No    Review of Systems  Constitutional: No  fever/chills Eyes: No visual changes. ENT: No sore throat. Cardiovascular: Denies chest pain. Respiratory: As above  gastrointestinal: No abdominal pain.  No nausea, no vomiting.  No diarrhea.  No constipation. Genitourinary: Negative for dysuria. Musculoskeletal: Negative for back pain. Skin: Negative for rash. Neurological: Negative for headaches, focal weakness   ____________________________________________   PHYSICAL EXAM:  VITAL SIGNS: ED Triage Vitals  Enc Vitals Group     BP 04/17/17 1101 127/85     Pulse Rate 04/17/17 1101 89     Resp 04/17/17 1101 (!) 22     Temp 04/17/17 1101 98.7 F (37.1 C)     Temp Source 04/17/17 1101 Oral     SpO2 04/17/17 1101 99 %     Weight 04/17/17 1102 130 lb (59 kg)     Height --      Head Circumference --      Peak Flow --      Pain Score 04/17/17 1101 8     Pain Loc --      Pain Edu? --      Excl. in GC? --     Constitutional: Alert and oriented. Well appearing and in no acute distress. Eyes: Conjunctivae are normal.  Head: Atraumatic. Nose: No congestion/rhinnorhea. Mouth/Throat: Mucous membranes are moist.  Neck: No stridor.  Tenderness to palpation to the cervical spine diffusely especially over the right trapezius muscle.  No deformity or step-off. Cardiovascular: Normal rate, regular rhythm. Grossly normal heart sounds.  Good peripheral circulation with equal and bilateral radial pulses.  Extremities feel warm to touch bilaterally. Respiratory: Normal respiratory effort.  No retractions. Lungs CTAB. Gastrointestinal: Soft and nontender. No distention. No CVA tenderness. Musculoskeletal: No lower extremity tenderness nor edema.  No joint effusions. Neurologic:  Normal speech and language.  No sensory deficit to light touch the bilateral upper extremities.  5 out of 5 grip strength as well as upper extremity strength.  However, the patient does have some slowing with fine motor movement of her fingers on the right hand.   However, she is able to touch each of her fingertips with her thumb. Skin:  Skin is warm, dry and intact. No rash noted. Psychiatric: Mood and affect are normal. Speech and behavior are normal.  ____________________________________________   LABS (all labs ordered are listed, but only abnormal results are displayed)  Labs Reviewed  CBC WITH DIFFERENTIAL/PLATELET - Abnormal; Notable for the following components:      Result Value   Lymphs Abs 0.8 (*)    Monocytes Absolute 0.1 (*)    All other components within normal limits  BASIC METABOLIC PANEL - Abnormal; Notable for the following components:   Glucose, Bld 124 (*)    Calcium 8.5 (*)    All other components within normal limits   ____________________________________________  EKG   ____________________________________________  RADIOLOGY  No acute disease ____________________________________________  PROCEDURES  Procedure(s) performed:   Procedures  Critical Care performed:   ____________________________________________   INITIAL IMPRESSION / ASSESSMENT AND PLAN / ED COURSE  Pertinent labs & imaging results that were available during my care of the patient were reviewed by me and considered in my medical decision making (see chart for details).  Differential includes, but is not limited to, viral syndrome, bronchitis including COPD exacerbation, pneumonia, reactive airway disease including asthma, CHF including exacerbation with or without pulmonary/interstitial edema, pneumothorax, ACS, thoracic trauma, and pulmonary embolism. Tracheal apathy, spinal stenosis, cervical disc herniation As part of my medical decision making, I reviewed the following data within the electronic MEDICAL RECORD NUMBER Notes from prior ED visits     ----------------------------------------- 310 PM on 04/17/2017 -----------------------------------------  Patient pending MRI results at this time.  I had reevaluated the patient and found  that she was persistently wheezing and felt short of breath.  I ordered an additional 3 nebulizer treatments.  Signed out to Dr. Fanny Bien for reevaluation of the patient's respiratory status as well as her MRI read.  Possible admission versus outpatient treatment for both the COPD and her radiculopathy.  ____________________________________________   FINAL CLINICAL IMPRESSION(S) / ED DIAGNOSES  Radiculopathy.  COPD exacerbation.    NEW MEDICATIONS STARTED DURING THIS VISIT:  This SmartLink is deprecated. Use AVSMEDLIST instead to display the medication list for a patient.   Note:  This document was prepared using Dragon voice recognition software and may include unintentional dictation errors.     Myrna Blazer, MD 04/17/17 1540

## 2017-04-17 NOTE — Discharge Instructions (Signed)
Follow-up with your primary doctor as soon as possible this week.  Follow-up with Dr. Adriana Simasook or Neurosurgery as soon as possible.  We believe that your wheezing symptoms are caused today by an exacerbation of your COPD, and possibly bronchitis.  Please take the prescribed medications and any medications that you have at home for your COPD.  Follow up with your doctor as recommended.  If you develop any new or worsening symptoms, including but not limited to fever, persistent vomiting, worsening shortness of breath, or other symptoms that concern you, please return to the Emergency Department immediately.   Call your doctor or return to the ED if you have a headache, worsening neck pain, sudden and severe headache, confusion, slurred speech, facial droop, weakness or worsening numbness in any arm or leg, extreme fatigue, vision problems, or other symptoms that concern you.

## 2017-05-10 IMAGING — CR DG CHEST 2V
1 series · 2 of 2 positions shown · non-contrast
Comparison: [DATE]

CLINICAL DATA: Chest pain

EXAM:
CHEST  2 VIEW

[Series 1: dg chest 2 view · 0.14mm/px · 2 of 2 slices shown]
[im 1/2]
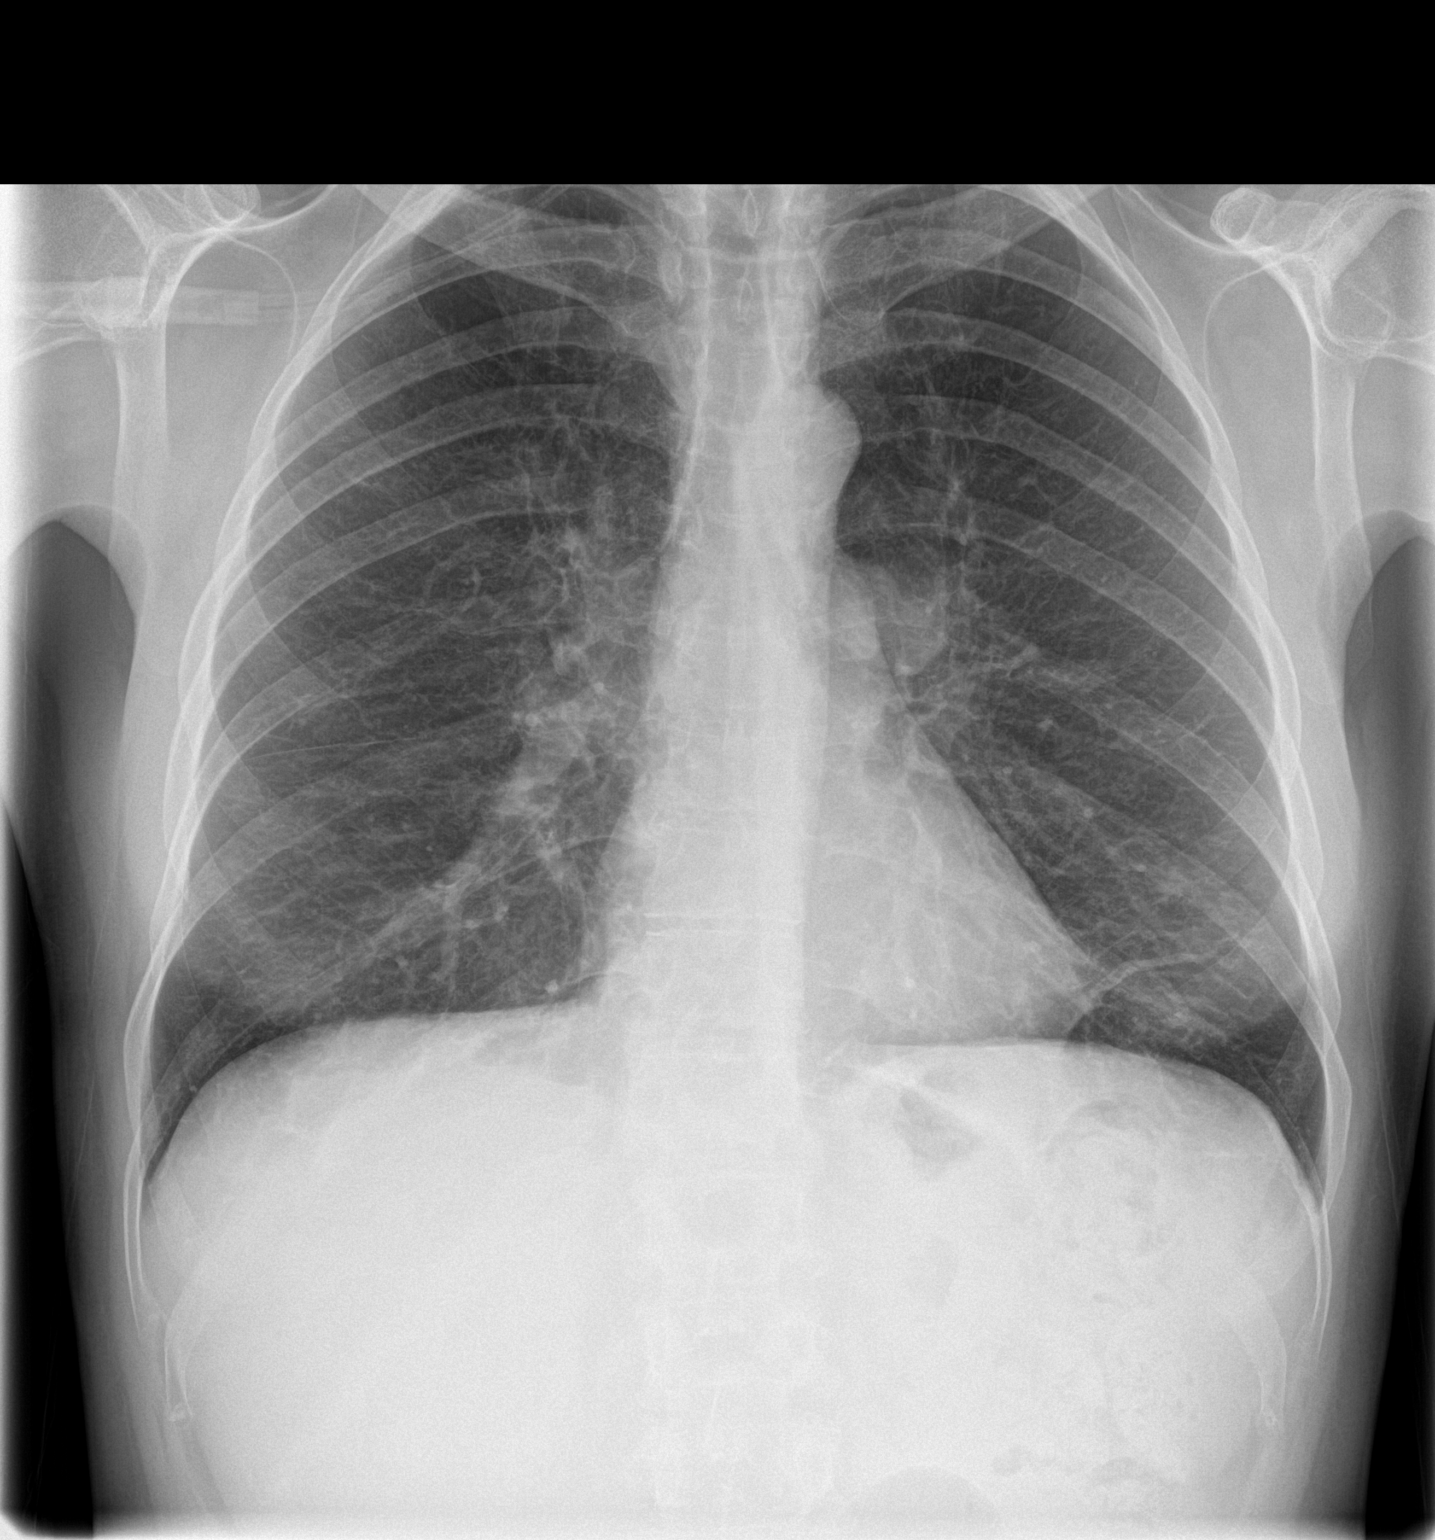
[im 2/2]
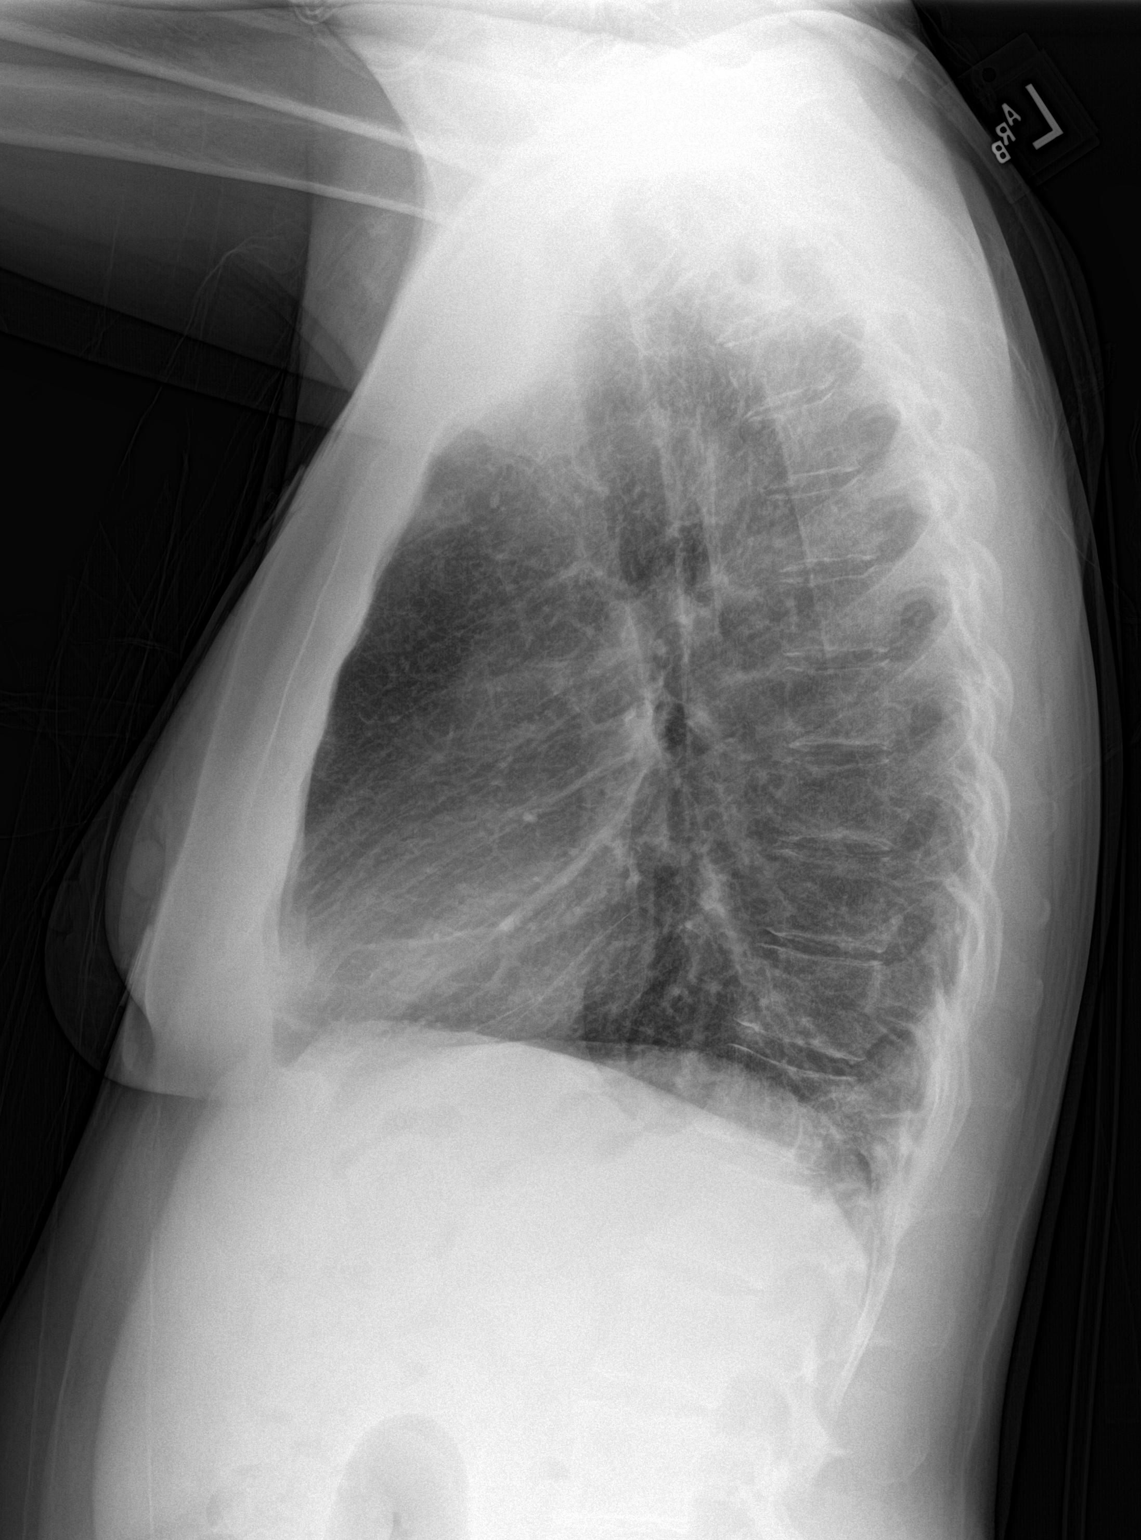

[2 of 2 positions shown; findings below may reference images not displayed]

FINDINGS: There is slight scarring in the left base region. The lungs
elsewhere are clear. Heart size and pulmonary vascularity are
normal. No adenopathy. No bone lesions. No pneumothorax.
IMPRESSION: Slight scarring left base.  No edema or consolidation.

## 2017-05-10 IMAGING — CT CT ANGIO CHEST
2 of 6 series · 18 of 36 positions shown · IV contrast (APPLIED)
Comparison: 07/17/2006

CLINICAL DATA: history of hyperlipidemia, smoking history with
COPD/emphysema who wears home 3 L nasal cannula, as well as anxiety,
is here for evaluation of central left-sided chest pain. Chest pain
started this morning when she woke up around 6 AM. It's been
constant with some intermittent excessively sharp left-sided chest
pains. Some radiation to the left arm. Significant anxiety. Mild
nausea without vomiting.

EXAM:
CT ANGIOGRAPHY CHEST WITH CONTRAST
TECHNIQUE: Multidetector CT imaging of the chest was performed using the
standard protocol during bolus administration of intravenous
contrast. Multiplanar CT image reconstructions and MIPs were
obtained to evaluate the vascular anatomy.
CONTRAST:  75 mL Isovue 370 IV

[Series 6: arterial · axial · arterial · 0.61mm/px · z∈[-282,-34]mm · 17 of 140 slices shown]
[im 8/140  lung]
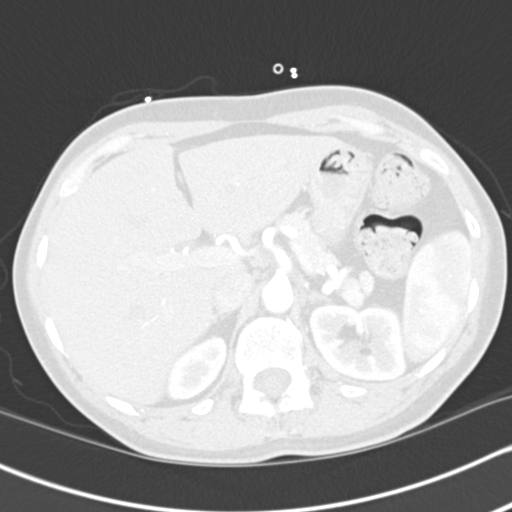
[im 15/140  mediastinal]
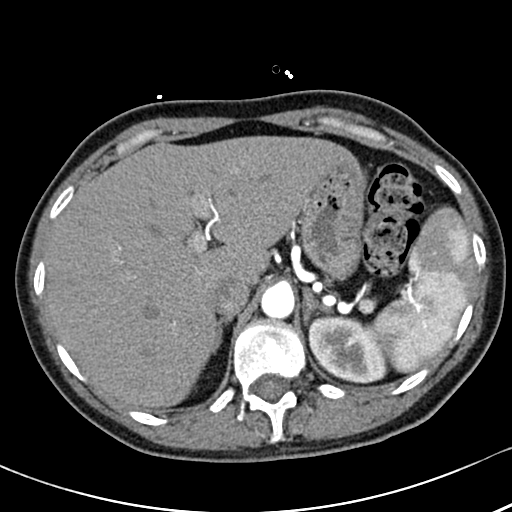
[im 22/140  lung]
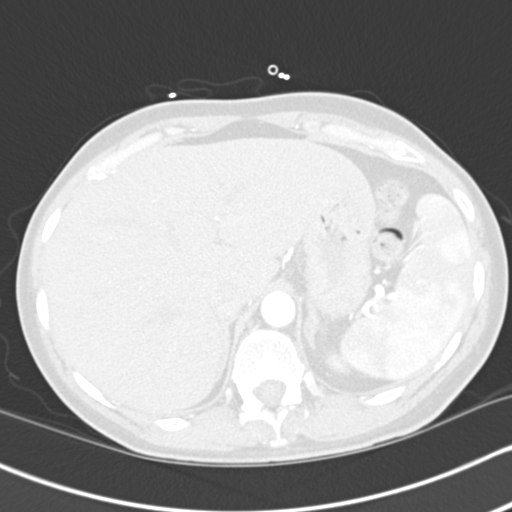
[im 30/140  mediastinal]
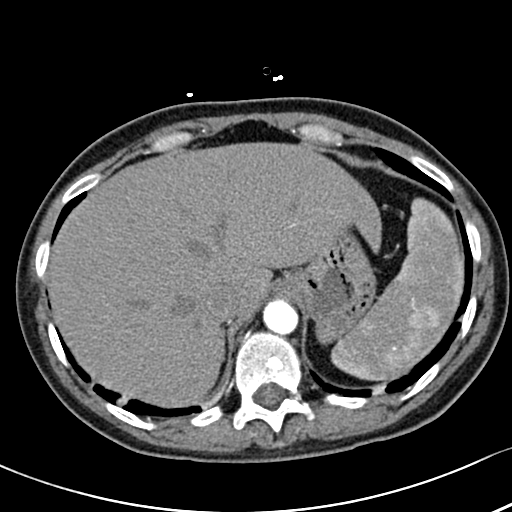
[im 37/140  lung]
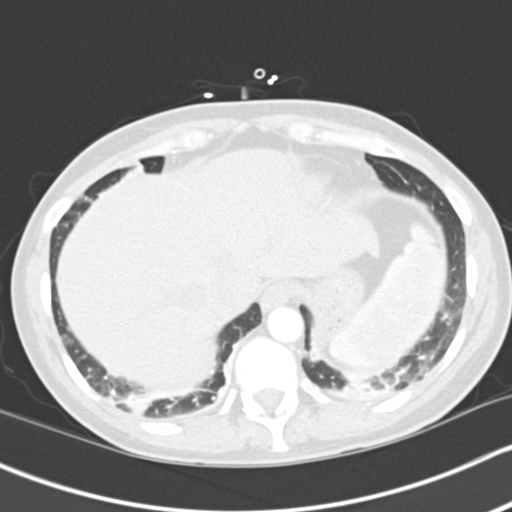
[im 44/140  mediastinal]
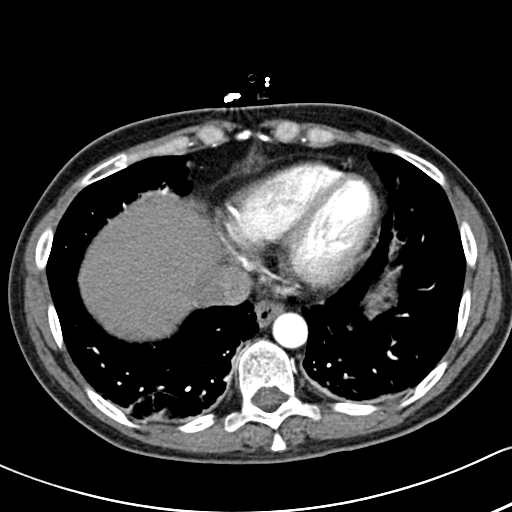
[im 52/140  lung]
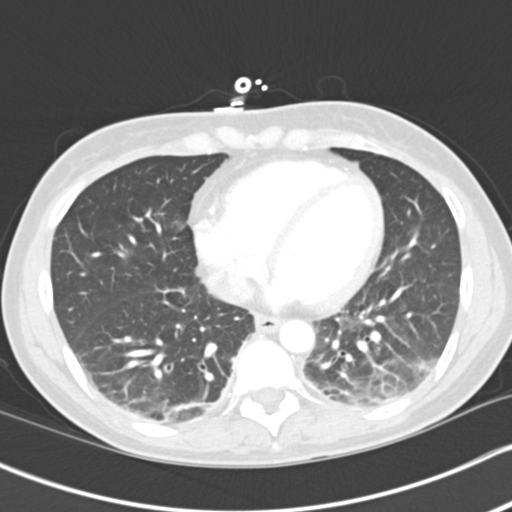
[im 59/140  mediastinal]
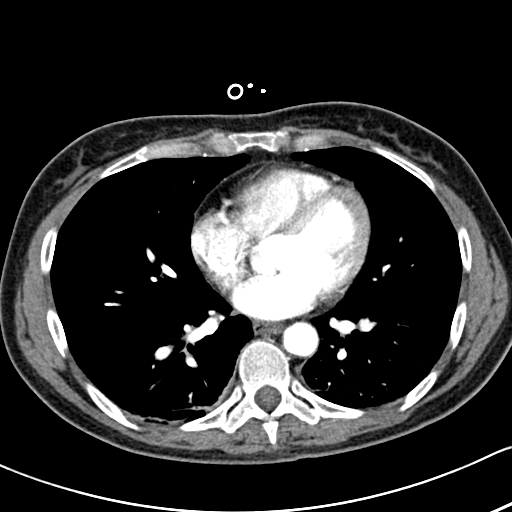
[im 74/140  lung]
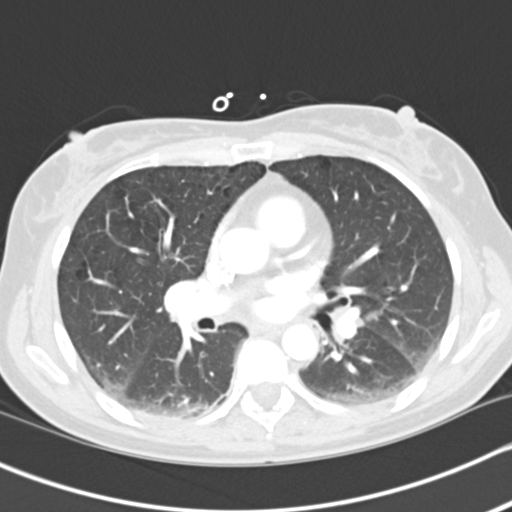
[im 81/140  mediastinal]
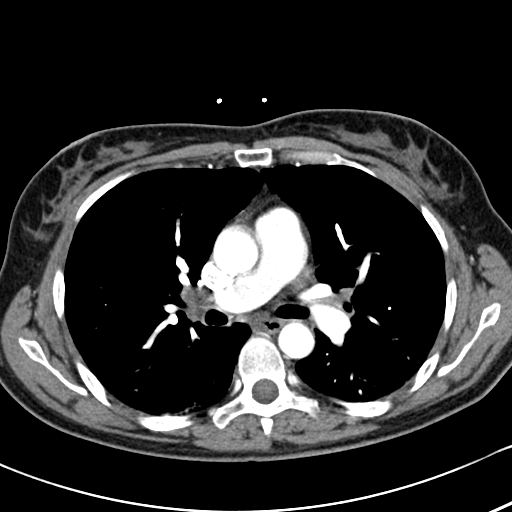
[im 88/140  lung]
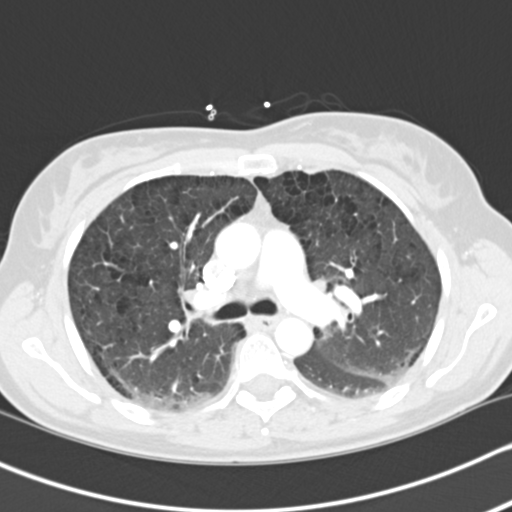
[im 96/140  mediastinal]
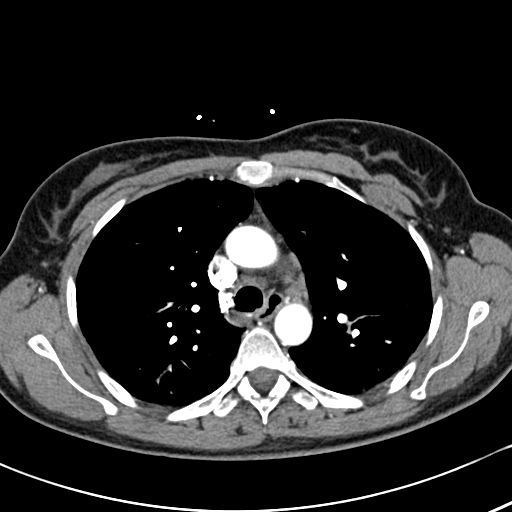
[im 103/140  lung]
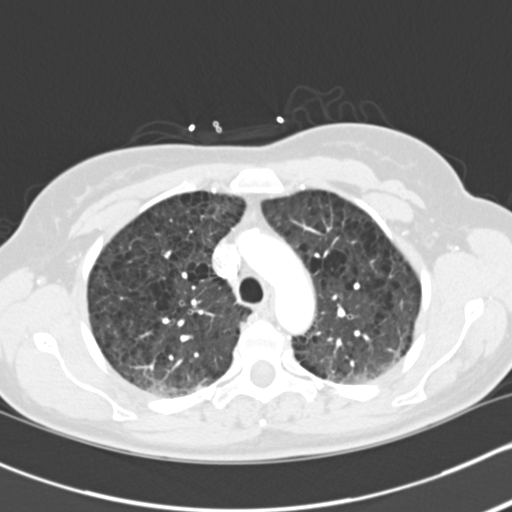
[im 110/140  mediastinal]
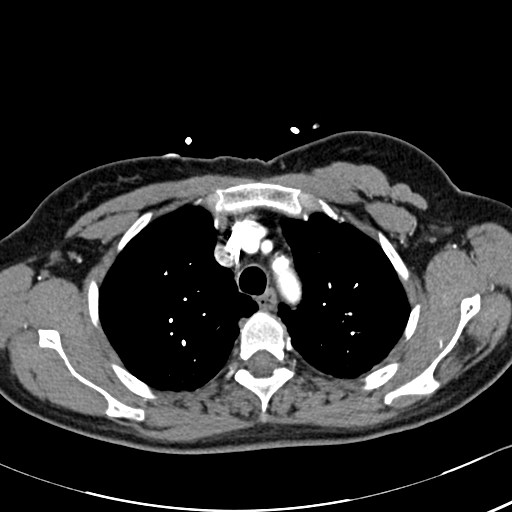
[im 118/140  lung]
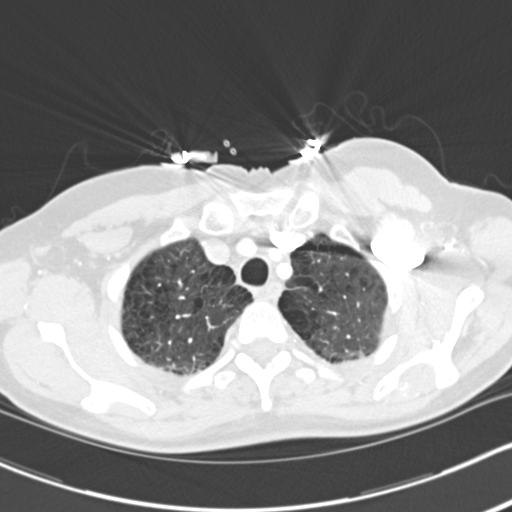
[im 125/140  mediastinal]
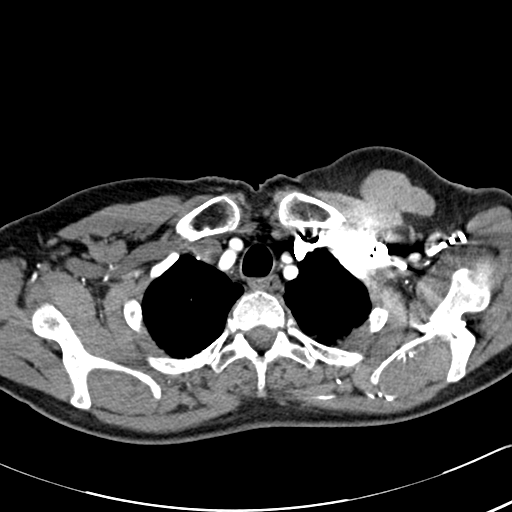
[im 132/140  lung]
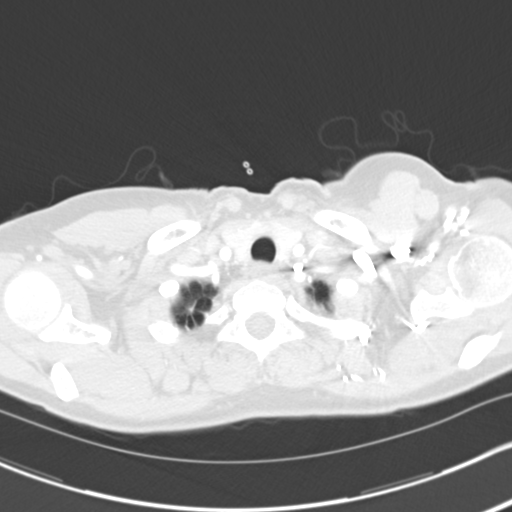

[Series 8: cor arterial mpr · coronal · arterial · 0.54mm/px · 1 of 106 slices shown]
[im 53/106  mediastinal]
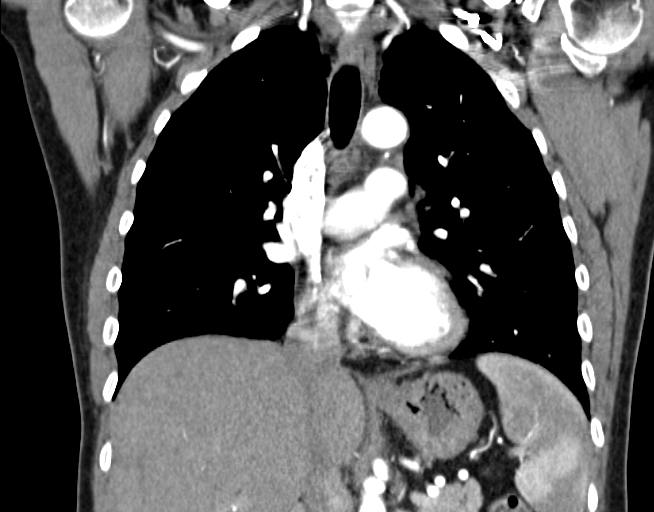

[18 of 36 positions shown; findings below may reference images not displayed]

FINDINGS: Vascular: No hyperdense crescent or mediastinal hematoma on the
noncontrast scan. Left arm IV contrast administration. Patent
innominate vein and SVC. Right atrium and right ventricle are
nondilated. Satisfactory opacification of pulmonary arteries noted,
and there is no evidence of pulmonary emboli. Patent bilateral
pulmonary veins drain into the left atrium. Adequate contrast
opacification of the thoracic aorta with no evidence of dissection,
aneurysm, or stenosis. There is classic 3-vessel brachiocephalic
arch anatomy without proximal stenosis.

Mediastinum/Lymph Nodes: Subcentimeter prevascular, AP window, and
precarinal lymph nodes. Upper limits normal low-attenuation right
hilar lymph nodes, stable. No pericardial effusion.

Lungs/Pleura: Emphysematous changes most marked in the upper lobes.
Dependent atelectasis and linear scarring posteriorly in the lower
lobes, progressive since prior study. 5 mm nodule adjacent to the
left major fissure image [DATE], stable and presumably benign. No new
nodule or infiltrate. No pleural effusion. No pneumothorax.

Upper abdomen: No acute findings.

Musculoskeletal: No chest wall mass or suspicious bone lesions
identified.

Review of the MIP images confirms the above findings.
IMPRESSION: 1. Negative for acute PE or thoracic aortic dissection.
2. Emphysema, with progressive linear scarring or atelectasis
posteriorly in both lower lobes.

## 2017-06-14 ENCOUNTER — Emergency Department: Payer: Medicare Other

## 2017-06-14 ENCOUNTER — Other Ambulatory Visit: Payer: Self-pay

## 2017-06-14 ENCOUNTER — Emergency Department
Admission: EM | Admit: 2017-06-14 | Discharge: 2017-06-14 | Disposition: A | Discharge status: Home / Self Care | Payer: Medicare Other | Attending: Emergency Medicine | Admitting: Emergency Medicine

## 2017-06-14 DIAGNOSIS — R51 Headache: Secondary | ICD-10-CM | POA: Insufficient documentation

## 2017-06-14 DIAGNOSIS — M436 Torticollis: Secondary | ICD-10-CM | POA: Insufficient documentation

## 2017-06-14 DIAGNOSIS — M542 Cervicalgia: Secondary | ICD-10-CM | POA: Diagnosis present

## 2017-06-14 DIAGNOSIS — F17210 Nicotine dependence, cigarettes, uncomplicated: Secondary | ICD-10-CM | POA: Diagnosis not present

## 2017-06-14 DIAGNOSIS — Z79899 Other long term (current) drug therapy: Secondary | ICD-10-CM | POA: Insufficient documentation

## 2017-06-14 LAB — COMPREHENSIVE METABOLIC PANEL
ALT: 16 U/L (ref 14–54)
ANION GAP: 7 (ref 5–15)
AST: 17 U/L (ref 15–41)
Albumin: 3.5 g/dL (ref 3.5–5.0)
Alkaline Phosphatase: 65 U/L (ref 38–126)
BUN: 11 mg/dL (ref 6–20)
CALCIUM: 8.5 mg/dL — AB (ref 8.9–10.3)
CHLORIDE: 105 mmol/L (ref 101–111)
CO2: 27 mmol/L (ref 22–32)
Creatinine, Ser: 0.85 mg/dL (ref 0.44–1.00)
Glucose, Bld: 94 mg/dL (ref 65–99)
Potassium: 4.6 mmol/L (ref 3.5–5.1)
Sodium: 139 mmol/L (ref 135–145)
Total Bilirubin: 0.3 mg/dL (ref 0.3–1.2)
Total Protein: 6.8 g/dL (ref 6.5–8.1)

## 2017-06-14 LAB — CBC WITH DIFFERENTIAL/PLATELET
Basophils Absolute: 0.1 10*3/uL (ref 0–0.1)
Basophils Relative: 1 %
EOS ABS: 0.1 10*3/uL (ref 0–0.7)
EOS PCT: 2 %
HCT: 42.2 % (ref 35.0–47.0)
Hemoglobin: 14.4 g/dL (ref 12.0–16.0)
LYMPHS ABS: 1.6 10*3/uL (ref 1.0–3.6)
LYMPHS PCT: 34 %
MCH: 31.7 pg (ref 26.0–34.0)
MCHC: 34.1 g/dL (ref 32.0–36.0)
MCV: 92.9 fL (ref 80.0–100.0)
MONOS PCT: 6 %
Monocytes Absolute: 0.3 10*3/uL (ref 0.2–0.9)
Neutro Abs: 2.7 10*3/uL (ref 1.4–6.5)
Neutrophils Relative %: 57 %
PLATELETS: 164 10*3/uL (ref 150–440)
RBC: 4.55 MIL/uL (ref 3.80–5.20)
RDW: 12.9 % (ref 11.5–14.5)
WBC: 4.8 10*3/uL (ref 3.6–11.0)

## 2017-06-14 MED ORDER — ORPHENADRINE CITRATE 30 MG/ML IJ SOLN
60.0000 mg | Freq: Once | INTRAMUSCULAR | Status: DC
  Filled 2017-06-14: qty 2

## 2017-06-14 MED ORDER — SODIUM CHLORIDE 0.9 % IV BOLUS (SEPSIS)
1000.0000 mL | Freq: Once | INTRAVENOUS | Status: AC
  Administered 2017-06-14: 1000 mL via INTRAVENOUS

## 2017-06-14 MED ORDER — MORPHINE SULFATE (PF) 4 MG/ML IV SOLN
4.0000 mg | Freq: Once | INTRAVENOUS | Status: AC
  Administered 2017-06-14: 4 mg via INTRAVENOUS
  Filled 2017-06-14: qty 1

## 2017-06-14 MED ORDER — METHOCARBAMOL 500 MG PO TABS: 500.0000 mg | ORAL_TABLET | Freq: Four times a day (QID) | ORAL | 0 refills | Status: AC

## 2017-06-14 MED ORDER — MELOXICAM 15 MG PO TABS: 15.0000 mg | ORAL_TABLET | Freq: Every day | ORAL | 0 refills | Status: DC

## 2017-06-14 MED ORDER — METHOCARBAMOL 500 MG PO TABS
1000.0000 mg | ORAL_TABLET | Freq: Once | ORAL | Status: AC
  Administered 2017-06-14: 1000 mg via ORAL
  Filled 2017-06-14: qty 2

## 2017-06-14 MED ORDER — ONDANSETRON HCL 4 MG/2ML IJ SOLN
4.0000 mg | Freq: Once | INTRAMUSCULAR | Status: AC
  Administered 2017-06-14: 4 mg via INTRAVENOUS
  Filled 2017-06-14: qty 2

## 2017-06-14 NOTE — ED Triage Notes (Signed)
She arrives ambulatory to triage with reports of being "very sick" for the last several days - pt describes flu like symptoms  She reports neck pain that has been present since awakening this am but the pain has gotten worse through the day  Chronic oxygen in use 2L

## 2017-06-14 NOTE — ED Provider Notes (Signed)
[facility] Emergency Department Provider Note  ____________________________________________  Time seen: Approximately 4:37 PM  I have reviewed the triage vital signs and the nursing notes.   HISTORY  Chief Complaint Neck Pain    HPI Tracie Mcdaniel is a 47 y.o. female who presents the emergency department complaining of right-sided neck pain and headache.  Patient reports that she had flulike symptoms started approximately 5 days ago.  These appear to be improving.  She had fevers and chills, nasal congestion, cough, body aches.  Patient reports that this morning she woke up and experienced right-sided neck pain that is worsened throughout the day.  She endorses a global headache with this, the worst she has ever experienced.  Patient denies any visual changes, she does endorse right-sided neck pain and right-sided neck stiffness.  She is able to turn her head appropriately to the left but states that turning to the right significantly increases pain.  Patient denies any chest pain, shortness of breath, abdominal pain, nausea or vomiting.  Patient does have emphysema and is on chronic oxygen.  Patient has a history of previous lumbar injury with bulging disc.  Patient denies any bowel or bladder dysfunction, saddle anesthesia, paresthesias.  No medications for this complaint prior to arrival.  No other complaints at this time.  Past Medical History:  Diagnosis Date  . Anxiety   . Bulging lumbar disc    x3  . Emphysema of lung (HCC)   . Osteoarthritis     There are no active problems to display for this patient.   Past Surgical History:  Procedure Laterality Date  . ABDOMINAL HYSTERECTOMY      Prior to Admission medications   Medication Sig Start Date End Date Taking? Authorizing Provider  acetaminophen-codeine (TYLENOL #2) 300-15 MG tablet Take 1 tablet by mouth every 4 (four) hours as needed for moderate pain.    [provider]  albuterol  (PROVENTIL HFA;VENTOLIN HFA) 108 (90 Base) MCG/ACT inhaler Inhale 2 puffs into the lungs every 6 (six) hours as needed for wheezing or shortness of breath.    [provider]  albuterol (PROVENTIL) (5 MG/ML) 0.5% nebulizer solution Take 2.5 mg by nebulization every 6 (six) hours as needed for wheezing or shortness of breath.    [provider]  ALPRAZolam Prudy Feeler(XANAX) 1 MG tablet Take 1 mg by mouth every 6 (six) hours as needed for anxiety.    [provider]  amitriptyline (ELAVIL) 100 MG tablet Take 100 mg by mouth at bedtime.    [provider]  azithromycin (ZITHROMAX Z-PAK) 250 MG tablet 1 tab daily by mouth starting on 04/18/2017 04/17/17   Sharyn CreamerQuale, Mark, MD  butalbital-acetaminophen-caffeine (FIORICET, ESGIC) 347406652750-325-40 MG tablet Take 1 tablet by mouth every 6 (six) hours as needed for migraine.    [provider]  diclofenac sodium (VOLTAREN) 1 % GEL Apply 2 g topically 4 (four) times daily as needed (for pain).    [provider]  Fluticasone-Salmeterol (ADVAIR) 500-50 MCG/DOSE AEPB Inhale 1 puff into the lungs 2 (two) times daily.    [provider]  gabapentin (NEURONTIN) 300 MG capsule Take 1,200 mg by mouth 3 (three) times daily.    [provider]  lactulose (CHRONULAC) 10 GM/15ML solution Take 10 g by mouth daily as needed.     [provider]  meloxicam (MOBIC) 15 MG tablet Take 1 tablet (15 mg total) by mouth daily. 06/14/17   Cuthriell, Delorise RoyalsJonathan D, PA-C  methocarbamol (ROBAXIN)  500 MG tablet Take 1 tablet (500 mg total) by mouth 4 (four) times daily. 06/14/17   Cuthriell, Delorise Royals, PA-C  omeprazole (PRILOSEC) 20 MG capsule Take 20 mg by mouth 2 (two) times daily before a meal.    [provider]  predniSONE (DELTASONE) 20 MG tablet Take 2 tablets (40 mg total) by mouth daily with breakfast. 04/17/17   Sharyn Creamer, MD  promethazine (PHENERGAN) 12.5 MG tablet Take 12.5 mg by mouth every 6 (six) hours as  needed for nausea or vomiting.    [provider]  sertraline (ZOLOFT) 100 MG tablet Take 250 mg by mouth daily.     [provider]  tiotropium (SPIRIVA) 18 MCG inhalation capsule Place 18 mcg into inhaler and inhale daily.    [provider]  topiramate (TOPAMAX) 50 MG tablet Take 150 mg by mouth 2 (two) times daily.    [provider]  triamcinolone ointment (KENALOG) 0.5 % Apply 1 application topically 2 (two) times daily as needed (for itching).    [provider]  zolpidem (AMBIEN) 10 MG tablet Take 10 mg by mouth at bedtime as needed for sleep.    [provider]    Allergies Ivp dye [iodinated diagnostic agents]  No family history on file.  Social History Social History   Tobacco Use  . Smoking status: Current Every Day Smoker    Packs/day: 0.50    Types: Cigarettes  . Smokeless tobacco: Never Used  Substance Use Topics  . Alcohol use: No  . Drug use: No     Review of Systems  Constitutional: No fever/chills Eyes: No visual changes. ENT: No upper respiratory complaints. Cardiovascular: no chest pain. Respiratory: no cough. No SOB. Gastrointestinal: No abdominal pain.  No nausea, no vomiting.  No diarrhea.  No constipation. Musculoskeletal: Positive for right-sided neck pain. Skin: Negative for rash, abrasions, lacerations, ecchymosis. Neurological: As noted for headache but denies focal weakness or numbness. 10-point ROS otherwise negative.  ____________________________________________   PHYSICAL EXAM:  VITAL SIGNS: ED Triage Vitals  Enc Vitals Group     BP 06/14/17 1529 121/81     Pulse Rate 06/14/17 1529 93     Resp 06/14/17 1529 17     Temp 06/14/17 1529 98.2 F (36.8 C)     Temp Source 06/14/17 1529 Oral     SpO2 06/14/17 1529 98 %     Weight 06/14/17 1530 125 lb (56.7 kg)     Height 06/14/17 1530 5\' 4"  (1.626 m)     Head Circumference --      Peak Flow --      Pain Score 06/14/17 1530 8      Pain Loc --      Pain Edu? --      Excl. in GC? --      Constitutional: Alert and oriented. Well appearing and in no acute distress. Eyes: Conjunctivae are normal. PERRL. EOMI. Head: Atraumatic. ENT:      Ears: EACs and TMs unremarkable.      Nose: No congestion/rhinnorhea.      Mouth/Throat: Mucous membranes are moist.  Neck: No stridor.  No visible deformity or abnormality to the neck.  No midline cervical spine tenderness to palpation.  Patient with tenderness to palpation over the right-sided paraspinal and trapezius muscle groups.  Spasms are appreciated on exam.  Patient has full range of motion of the extension, flexion, left rotation of the neck.  Limited rotation to the right side of the  neck due to pain.  Sensation intact all 5 digits bilateral lower extremity in all dermatomal distributions bilaterally.  Radial pulse intact distally.  Negative Kernig's and Brudzinski's. Hematological/Lymphatic/Immunilogical: No cervical lymphadenopathy. Cardiovascular: Normal rate, regular rhythm. Normal S1 and S2.  Good peripheral circulation. Respiratory: Normal respiratory effort without tachypnea or retractions. Lungs CTAB. Good air entry to the bases with no decreased or absent breath sounds. Musculoskeletal: Full range of motion to all extremities. No gross deformities appreciated. Neurologic:  Normal speech and language. No gross focal neurologic deficits are appreciated.  Cranial nerves II through XII grossly intact.   Skin:  Skin is warm, dry and intact. No rash noted. Psychiatric: Mood and affect are normal. Speech and behavior are normal. Patient exhibits appropriate insight and judgement.   ____________________________________________   LABS (all labs ordered are listed, but only abnormal results are displayed)  Labs Reviewed  COMPREHENSIVE METABOLIC PANEL - Abnormal; Notable for the following components:      Result Value   Calcium 8.5 (*)    All other components within normal  limits  CBC WITH DIFFERENTIAL/PLATELET   ____________________________________________  EKG   ____________________________________________  RADIOLOGY Festus Barren Cuthriell, personally viewed and evaluated these images as part of my medical decision making, as well as reviewing the written report by the radiologist.  I concur with radiologist finding of no acute intracranial or osseous abnormality to the head and neck.  Ct Head Wo Contrast  Result Date: 06/14/2017 CLINICAL DATA:  Neck pain.  Flu like symptoms.  No injury. EXAM: CT HEAD WITHOUT CONTRAST CT CERVICAL SPINE WITHOUT CONTRAST TECHNIQUE: Multidetector CT imaging of the head and cervical spine was performed following the standard protocol without intravenous contrast. Multiplanar CT image reconstructions of the cervical spine were also generated. COMPARISON:  CT head 02/16/2005 and MRI of the cervical spine from 04/17/2017 FINDINGS: CT HEAD FINDINGS Brain: No evidence of acute infarction, hemorrhage, hydrocephalus, extra-axial collection or mass lesion/mass effect. Vascular: No hyperdense vessel or unexpected calcification. Skull: Normal. Negative for fracture or focal lesion. Sinuses/Orbits: The paranasal sinuses and mastoid air cells are clear. The calvarium appears intact. Other: None. CT CERVICAL SPINE FINDINGS Alignment: Normal. Skull base and vertebrae: No acute fracture. No primary bone lesion or focal pathologic process. Soft tissues and spinal canal: No prevertebral fluid or swelling. No visible canal hematoma. Disc levels:  Normal. Upper chest: Advanced changes of emphysema noted. Other: None IMPRESSION: 1. Normal brain. 2. No evidence for cervical spine fracture or dislocation. 3.  Emphysema (ICD10-J43.9). Electronically Signed   By: Signa Kell M.D.   On: 06/14/2017 17:04   Ct Cervical Spine Wo Contrast  Result Date: 06/14/2017 CLINICAL DATA:  Neck pain.  Flu like symptoms.  No injury. EXAM: CT HEAD WITHOUT CONTRAST CT  CERVICAL SPINE WITHOUT CONTRAST TECHNIQUE: Multidetector CT imaging of the head and cervical spine was performed following the standard protocol without intravenous contrast. Multiplanar CT image reconstructions of the cervical spine were also generated. COMPARISON:  CT head 02/16/2005 and MRI of the cervical spine from 04/17/2017 FINDINGS: CT HEAD FINDINGS Brain: No evidence of acute infarction, hemorrhage, hydrocephalus, extra-axial collection or mass lesion/mass effect. Vascular: No hyperdense vessel or unexpected calcification. Skull: Normal. Negative for fracture or focal lesion. Sinuses/Orbits: The paranasal sinuses and mastoid air cells are clear. The calvarium appears intact. Other: None. CT CERVICAL SPINE FINDINGS Alignment: Normal. Skull base and vertebrae: No acute fracture. No primary bone lesion or focal pathologic process. Soft tissues and spinal canal: No prevertebral fluid  or swelling. No visible canal hematoma. Disc levels:  Normal. Upper chest: Advanced changes of emphysema noted. Other: None IMPRESSION: 1. Normal brain. 2. No evidence for cervical spine fracture or dislocation. 3.  Emphysema (ICD10-J43.9). Electronically Signed   By: Signa Kell M.D.   On: 06/14/2017 17:04    ____________________________________________    PROCEDURES  Procedure(s) performed:    Procedures    Medications  orphenadrine (NORFLEX) injection 60 mg (not administered)  sodium chloride 0.9 % bolus 1,000 mL (1,000 mLs Intravenous New Bag/Given 06/14/17 1724)  morphine 4 MG/ML injection 4 mg (4 mg Intravenous Given 06/14/17 1800)  ondansetron (ZOFRAN) injection 4 mg (4 mg Intravenous Given 06/14/17 1800)     ____________________________________________   INITIAL IMPRESSION / ASSESSMENT AND PLAN / ED COURSE  Pertinent labs & imaging results that were available during my care of the patient were reviewed by me and considered in my medical decision making (see chart for details).  Review of the  [place] CSRS was performed in accordance of the [facility] prior to dispensing any controlled drugs.  Clinical Course as of Jun 14 1814  Wed Jun 14, 2017  1655 Patient presented to the emergency department with right-sided neck pain and headache after what sounds to be illness with influenza.  Overall, symptoms and exam were consistent with torticollis, however due to recent illness, significant headache and neck pain, differential included intracranial hemorrhage, meningitis, cervical spine injury.  Basic labs and CT scans will be ordered and evaluated.  [JC]    Clinical Course User Index [JC] Cuthriell, Delorise Royals, PA-C    Patient's diagnosis is consistent with torticollis.  Patient presents emergency department complaining of right-sided neck pain and headache.  Patient has had several days worth of flulike illness.  Patient has been eating and drinking less, sleeping in awkward positions trying to find good rest.  I suspect torticollis, however due to patient's recent illness, with both neck pain and headache, patient was evaluated with basic labs and CT scans.  CT is returned without any acute intracranial or osseous abnormality.  Lab work returns with reassuring results.  No indication of meningitis on exam.  With reassuring exam and labs and CT with reassuring results, no indication for further workup.  Patient will be discharged home with prescriptions for meloxicam and Robaxin for symptom control. Patient is to follow up with primary care as needed or otherwise directed. Patient is given ED precautions to return to the ED for any worsening or new symptoms.     ____________________________________________  FINAL CLINICAL IMPRESSION(S) / ED DIAGNOSES  Final diagnoses:  Torticollis      NEW MEDICATIONS STARTED DURING THIS VISIT:  ED Discharge Orders        Ordered    meloxicam (MOBIC) 15 MG tablet  Daily     06/14/17 1816    methocarbamol (ROBAXIN) 500 MG tablet  4 times daily      06/14/17 1816          This chart was dictated using voice recognition software/Dragon. Despite best efforts to proofread, errors can occur which can change the meaning. Any change was purely unintentional.    Racheal Patches, PA-C 06/14/17 1816    Jeanmarie Plant, MD 06/14/17 [phone]

## 2018-02-09 ENCOUNTER — Emergency Department
Admission: EM | Admit: 2018-02-09 | Discharge: 2018-02-09 | Disposition: A | Discharge status: Home / Self Care | Payer: Medicare Other | Attending: Student in an Organized Health Care Education/Training Program | Admitting: Student in an Organized Health Care Education/Training Program

## 2018-02-09 ENCOUNTER — Encounter: Payer: Self-pay | Admitting: Emergency Medicine

## 2018-02-09 ENCOUNTER — Emergency Department: Payer: Medicare Other

## 2018-02-09 ENCOUNTER — Other Ambulatory Visit: Payer: Self-pay

## 2018-02-09 DIAGNOSIS — F17210 Nicotine dependence, cigarettes, uncomplicated: Secondary | ICD-10-CM | POA: Insufficient documentation

## 2018-02-09 DIAGNOSIS — R0789 Other chest pain: Secondary | ICD-10-CM | POA: Diagnosis not present

## 2018-02-09 DIAGNOSIS — J441 Chronic obstructive pulmonary disease with (acute) exacerbation: Secondary | ICD-10-CM

## 2018-02-09 DIAGNOSIS — R05 Cough: Secondary | ICD-10-CM | POA: Diagnosis present

## 2018-02-09 DIAGNOSIS — Z79899 Other long term (current) drug therapy: Secondary | ICD-10-CM | POA: Insufficient documentation

## 2018-02-09 LAB — CBC
HCT: 39.7 % (ref 36.0–46.0)
Hemoglobin: 13.7 g/dL (ref 12.0–15.0)
MCH: 31.8 pg (ref 26.0–34.0)
MCHC: 34.5 g/dL (ref 30.0–36.0)
MCV: 92.1 fL (ref 80.0–100.0)
PLATELETS: 204 10*3/uL (ref 150–400)
RBC: 4.31 MIL/uL (ref 3.87–5.11)
RDW: 12 % (ref 11.5–15.5)
WBC: 5.3 10*3/uL (ref 4.0–10.5)
nRBC: 0 % (ref 0.0–0.2)

## 2018-02-09 LAB — BASIC METABOLIC PANEL
ANION GAP: 9 (ref 5–15)
BUN: 9 mg/dL (ref 6–20)
CO2: 26 mmol/L (ref 22–32)
Calcium: 8.8 mg/dL — ABNORMAL LOW (ref 8.9–10.3)
Chloride: 106 mmol/L (ref 98–111)
Creatinine, Ser: 0.7 mg/dL (ref 0.44–1.00)
GFR calc Af Amer: >60 mL/min (ref >60)
Glucose, Bld: 137 mg/dL — ABNORMAL HIGH (ref 70–99)
Potassium: 3.2 mmol/L — ABNORMAL LOW (ref 3.5–5.1)
SODIUM: 141 mmol/L (ref 135–145)

## 2018-02-09 LAB — TROPONIN I

## 2018-02-09 MED ORDER — METHYLPREDNISOLONE SODIUM SUCC 125 MG IJ SOLR
125.0000 mg | Freq: Once | INTRAMUSCULAR | Status: AC
  Administered 2018-02-09: 125 mg via INTRAVENOUS
  Filled 2018-02-09: qty 2

## 2018-02-09 MED ORDER — PREDNISONE 10 MG PO TABS: 10.0000 mg | ORAL_TABLET | Freq: Every day | ORAL | 0 refills | Status: DC

## 2018-02-09 MED ORDER — LORAZEPAM 1 MG PO TABS
1.0000 mg | ORAL_TABLET | Freq: Once | ORAL | Status: AC
  Administered 2018-02-09: 1 mg via ORAL
  Filled 2018-02-09: qty 1

## 2018-02-09 MED ORDER — IPRATROPIUM-ALBUTEROL 0.5-2.5 (3) MG/3ML IN SOLN
3.0000 mL | Freq: Once | RESPIRATORY_TRACT | Status: AC
  Administered 2018-02-09: 3 mL via RESPIRATORY_TRACT
  Filled 2018-02-09: qty 3

## 2018-02-09 MED ORDER — DOXYCYCLINE HYCLATE 100 MG PO TABS: 100.0000 mg | ORAL_TABLET | Freq: Two times a day (BID) | ORAL | 0 refills | Status: AC

## 2018-02-09 MED ORDER — HYDROCODONE-ACETAMINOPHEN 5-325 MG PO TABS
1.0000 | ORAL_TABLET | Freq: Once | ORAL | Status: AC
  Administered 2018-02-09: 1 via ORAL
  Filled 2018-02-09: qty 1

## 2018-02-09 NOTE — ED Triage Notes (Signed)
Pt presents to ED via [facility] with c/o COB and CP. Pt states hx of COPD, on chronic 2L via [place]. Worsening SOB since Wednesday with productive cough and brown sputum. Pt c/o R sided chest pain, worse with movement at this time.

## 2018-02-09 NOTE — ED Notes (Signed)
Ambulatory sat down to 91% temporarily, pt satting 91-98% while ambulating on chronic 2L [place].

## 2018-02-09 NOTE — ED Provider Notes (Signed)
[facility] Provider Note    First MD Initiated Contact with Patient 02/09/18 1047     (approximate)  I have reviewed the triage vital signs and the nursing notes.   HISTORY  Chief Complaint Shortness of Breath and Chest Pain    HPI Tracie Mcdaniel is a 47 y.o. female with emphysema on chronic 2 L nasal cannula presents the ER for worsening productive cough for the past several weeks now with worsening shortness of breath and exertional dyspnea.  Started feeling palpitations and pain in her chest while she was driving her grandson to school.  States that she is getting short of breath even with speaking right now.  Is not been on any steroids recently.  Denies any measured fevers.  No nausea or vomiting.    Past Medical History:  Diagnosis Date  . Anxiety   . Bulging lumbar disc    x3  . Emphysema of lung (HCC)   . Osteoarthritis    History reviewed. No pertinent family history. Past Surgical History:  Procedure Laterality Date  . ABDOMINAL HYSTERECTOMY     There are no active problems to display for this patient.     Prior to Admission medications   Medication Sig Start Date End Date Taking? Authorizing Provider  acetaminophen-codeine (TYLENOL #2) 300-15 MG tablet Take 1 tablet by mouth every 4 (four) hours as needed for moderate pain.    [provider]  albuterol (PROVENTIL HFA;VENTOLIN HFA) 108 (90 Base) MCG/ACT inhaler Inhale 2 puffs into the lungs every 6 (six) hours as needed for wheezing or shortness of breath.    [provider]  albuterol (PROVENTIL) (5 MG/ML) 0.5% nebulizer solution Take 2.5 mg by nebulization every 6 (six) hours as needed for wheezing or shortness of breath.    [provider]  ALPRAZolam Prudy Feeler) 1 MG tablet Take 1 mg by mouth every 6 (six) hours as needed for anxiety.    [provider]  amitriptyline (ELAVIL) 100 MG tablet Take 100 mg by mouth at bedtime.     [provider]  azithromycin (ZITHROMAX Z-PAK) 250 MG tablet 1 tab daily by mouth starting on 04/18/2017 04/17/17   Sharyn Creamer, MD  butalbital-acetaminophen-caffeine (FIORICET, ESGIC) [phone] MG tablet Take 1 tablet by mouth every 6 (six) hours as needed for migraine.    [provider]  diclofenac sodium (VOLTAREN) 1 % GEL Apply 2 g topically 4 (four) times daily as needed (for pain).    [provider]  doxycycline (VIBRA-TABS) 100 MG tablet Take 1 tablet (100 mg total) by mouth 2 (two) times daily for 7 days. 02/09/18 02/16/18  Willy Eddy, MD  Fluticasone-Salmeterol (ADVAIR) 500-50 MCG/DOSE AEPB Inhale 1 puff into the lungs 2 (two) times daily.    [provider]  gabapentin (NEURONTIN) 300 MG capsule Take 1,200 mg by mouth 3 (three) times daily.    [provider]  lactulose (CHRONULAC) 10 GM/15ML solution Take 10 g by mouth daily as needed.     [provider]  meloxicam (MOBIC) 15 MG tablet Take 1 tablet (15 mg total) by mouth daily. 06/14/17   Cuthriell, Delorise Royals, PA-C  methocarbamol (ROBAXIN) 500 MG tablet Take 1 tablet (500 mg total) by mouth 4 (four) times daily. 06/14/17   Cuthriell, Delorise Royals, PA-C  omeprazole (PRILOSEC) 20 MG capsule Take 20 mg by mouth 2 (two) times daily before a meal.    [provider]  predniSONE (DELTASONE) 10  MG tablet Take 1 tablet (10 mg total) by mouth daily. Day 1-2: Take 50 mg  ( 5 pills) Day 3-4 : Take 40 mg (4pills) Day 5-6: Take 30 mg (3 pills) Day 7-8:  Take 20 mg (2 pills) Day 9:  Take 10mg  (1 pill) 02/09/18   Willy Eddy, MD  predniSONE (DELTASONE) 20 MG tablet Take 2 tablets (40 mg total) by mouth daily with breakfast. 04/17/17   Sharyn Creamer, MD  promethazine (PHENERGAN) 12.5 MG tablet Take 12.5 mg by mouth every 6 (six) hours as needed for nausea or vomiting.    [provider]  sertraline (ZOLOFT) 100 MG tablet Take 250 mg by mouth daily.     [provider]  tiotropium (SPIRIVA) 18 MCG inhalation capsule Place 18 mcg into inhaler and inhale daily.    [provider]  topiramate (TOPAMAX) 50 MG tablet Take 150 mg by mouth 2 (two) times daily.    [provider]  triamcinolone ointment (KENALOG) 0.5 % Apply 1 application topically 2 (two) times daily as needed (for itching).    [provider]  zolpidem (AMBIEN) 10 MG tablet Take 10 mg by mouth at bedtime as needed for sleep.    [provider]    Allergies Ivp dye [iodinated diagnostic agents]    Social History Social History   Tobacco Use  . Smoking status: Current Every Day Smoker    Packs/day: 0.50    Types: Cigarettes  . Smokeless tobacco: Never Used  Substance Use Topics  . Alcohol use: No  . Drug use: No    Review of Systems Patient denies headaches, rhinorrhea, blurry vision, numbness, shortness of breath, chest pain, edema, cough, abdominal pain, nausea, vomiting, diarrhea, dysuria, fevers, rashes or hallucinations unless otherwise stated above in HPI. ____________________________________________   PHYSICAL EXAM:  VITAL SIGNS: Vitals:   02/09/18 1303 02/09/18 1443  BP: 112/69 102/79  Pulse: 94 99  Resp: 20 18  Temp:    SpO2: 99% 100%    Constitutional: Alert and oriented.  Eyes: Conjunctivae are normal.  Head: Atraumatic. Nose: No congestion/rhinnorhea. Mouth/Throat: Mucous membranes are moist.   Neck: No stridor. Painless ROM.  Cardiovascular: Normal rate, regular rhythm. Grossly normal heart sounds.  Good peripheral circulation. Respiratory: Mild tachypnea, prolonged expiratory phase, diminished breath sounds throughout with faint end expiratory wheeze. Gastrointestinal: Soft and nontender. No distention. No abdominal bruits. No CVA tenderness. Genitourinary:  Musculoskeletal: No lower extremity tenderness nor edema.  No joint effusions. Neurologic:  Normal speech and language. No gross focal neurologic  deficits are appreciated. No facial droop Skin:  Skin is warm, dry and intact. No rash noted. Psychiatric: Mood and affect are normal. Speech and behavior are normal.  ____________________________________________   LABS (all labs ordered are listed, but only abnormal results are displayed)  Results for orders placed or performed during the hospital encounter of 02/09/18 (from the past 24 hour(s))  Basic metabolic panel     Status: Abnormal   Collection Time: 02/09/18 10:41 AM  Result Value Ref Range   Sodium 141 135 - 145 mmol/L   Potassium 3.2 (L) 3.5 - 5.1 mmol/L   Chloride 106 98 - 111 mmol/L   CO2 26 22 - 32 mmol/L   Glucose, Bld 137 (H) 70 - 99 mg/dL   BUN 9 6 - 20 mg/dL   Creatinine, Ser 1.61 0.44 - 1.00 mg/dL   Calcium 8.8 (L) 8.9 - 10.3 mg/dL   GFR calc non Af Amer >60 >60  mL/min   GFR calc Af Amer >60 >60 mL/min   Anion gap 9 5 - 15  CBC     Status: None   Collection Time: 02/09/18 10:41 AM  Result Value Ref Range   WBC 5.3 4.0 - 10.5 K/uL   RBC 4.31 3.87 - 5.11 MIL/uL   Hemoglobin 13.7 12.0 - 15.0 g/dL   HCT 40.9 81.1 - 91.4 %   MCV 92.1 80.0 - 100.0 fL   MCH 31.8 26.0 - 34.0 pg   MCHC 34.5 30.0 - 36.0 g/dL   RDW 78.2 95.6 - 21.3 %   Platelets 204 150 - 400 K/uL   nRBC 0.0 0.0 - 0.2 %  Troponin I     Status: None   Collection Time: 02/09/18 10:41 AM  Result Value Ref Range   Troponin I <0.03 <0.03 ng/mL  Troponin I     Status: None   Collection Time: 02/09/18  1:35 PM  Result Value Ref Range   Troponin I <0.03 <0.03 ng/mL   ____________________________________________  EKG My review and personal interpretation at Time: 10:35   Indication: sob  Rate: 95  Rhythm: sinus Axis: normal Other: normal intervals, no stemi ____________________________________________  RADIOLOGY  I personally reviewed all radiographic images ordered to evaluate for the above acute complaints and reviewed radiology reports and findings.  These findings were personally discussed  with the patient.  Please see medical record for radiology report.  ____________________________________________   PROCEDURES  Procedure(s) performed:  Procedures    Critical Care performed: no ____________________________________________   INITIAL IMPRESSION / ASSESSMENT AND PLAN / ED COURSE  Pertinent labs & imaging results that were available during my care of the patient were reviewed by me and considered in my medical decision making (see chart for details).   DDX: Asthma, copd, CHF, pna, ptx, malignancy, Pe, anemia   Tracie Mcdaniel is a 47 y.o. who presents to the ED with symptoms as described above.  Patient with symptoms most suggestive of COPD.  Chest x-ray shows no evidence of consolidation or edema Give nebs as well as steroids and reassess.  Does not seem clinically consistent with  Clinical Course as of Feb 09 1542  Fri Feb 09, 2018  1147 Patient reassessed after nebulizer treatment.  Moving much more air.  Wheezing less pronounced.  Patient states she feels very anxious requesting something for anxiety.   [PR]  1334 Patient able to ablate without any significant hypoxia or respiratory distress.  Seems primarily consistent with COPD exacerbation.  Will order repeat troponin to further stratify given chest pain reported prior to arrival.   [PR]  1442 Repeat troponin is negative.  Patient requesting DC home.  Have discussed with the patient and available family all diagnostics and treatments performed thus far and all questions were answered to the best of my ability. The patient demonstrates understanding and agreement with plan.    [PR]    Clinical Course User Index [PR] Willy Eddy, MD     As part of my medical decision making, I reviewed the following data within the electronic MEDICAL RECORD NUMBER Nursing notes reviewed and incorporated, Labs reviewed, notes from prior ED visits and [place] Controlled Substance  Database   ____________________________________________   FINAL CLINICAL IMPRESSION(S) / ED DIAGNOSES  Final diagnoses:  COPD exacerbation (HCC)  Atypical chest pain      NEW MEDICATIONS STARTED DURING THIS VISIT:  Discharge Medication List as of 02/09/2018  2:42 PM    START taking these  medications   Details  doxycycline (VIBRA-TABS) 100 MG tablet Take 1 tablet (100 mg total) by mouth 2 (two) times daily for 7 days., Starting Fri 02/09/2018, Until Fri 02/16/2018, Print    !! predniSONE (DELTASONE) 10 MG tablet Take 1 tablet (10 mg total) by mouth daily. Day 1-2: Take 50 mg  ( 5 pills) Day 3-4 : Take 40 mg (4pills) Day 5-6: Take 30 mg (3 pills) Day 7-8:  Take 20 mg (2 pills) Day 9:  Take 10mg  (1 pill), Starting Fri 02/09/2018, Print     !! - Potential duplicate medications found. Please discuss with provider.       Note:  This document was prepared using Dragon voice recognition software and may include unintentional dictation errors.    Willy Eddy, MD 02/09/18 [phone]

## 2018-05-15 ENCOUNTER — Emergency Department
Admission: EM | Admit: 2018-05-15 | Discharge: 2018-05-15 | Disposition: A | Discharge status: Home / Self Care | Payer: Medicare Other | Attending: Emergency Medicine | Admitting: Emergency Medicine

## 2018-05-15 ENCOUNTER — Encounter: Payer: Self-pay | Admitting: Intensive Care

## 2018-05-15 ENCOUNTER — Other Ambulatory Visit: Payer: Self-pay

## 2018-05-15 DIAGNOSIS — M79605 Pain in left leg: Secondary | ICD-10-CM | POA: Insufficient documentation

## 2018-05-15 DIAGNOSIS — F17210 Nicotine dependence, cigarettes, uncomplicated: Secondary | ICD-10-CM | POA: Insufficient documentation

## 2018-05-15 DIAGNOSIS — M545 Low back pain: Secondary | ICD-10-CM | POA: Diagnosis present

## 2018-05-15 DIAGNOSIS — M5442 Lumbago with sciatica, left side: Secondary | ICD-10-CM | POA: Diagnosis not present

## 2018-05-15 DIAGNOSIS — Z79899 Other long term (current) drug therapy: Secondary | ICD-10-CM | POA: Diagnosis not present

## 2018-05-15 HISTORY — DX: Age-related osteoporosis without current pathological fracture: M81.0

## 2018-05-15 HISTORY — DX: Fibromyalgia: M79.7

## 2018-05-15 MED ORDER — CYCLOBENZAPRINE HCL 10 MG PO TABS
10.0000 mg | ORAL_TABLET | Freq: Once | ORAL | Status: AC
  Administered 2018-05-15: 10 mg via ORAL
  Filled 2018-05-15: qty 1

## 2018-05-15 MED ORDER — HYDROCODONE-ACETAMINOPHEN 5-325 MG PO TABS: 1.0000 | ORAL_TABLET | Freq: Four times a day (QID) | ORAL | 0 refills | Status: DC | PRN

## 2018-05-15 MED ORDER — OXYCODONE-ACETAMINOPHEN 5-325 MG PO TABS
1.0000 | ORAL_TABLET | Freq: Once | ORAL | Status: AC
  Administered 2018-05-15: 1 via ORAL
  Filled 2018-05-15: qty 1

## 2018-05-15 MED ORDER — KETOROLAC TROMETHAMINE 30 MG/ML IJ SOLN
30.0000 mg | Freq: Once | INTRAMUSCULAR | Status: AC
  Administered 2018-05-15: 30 mg via INTRAMUSCULAR
  Filled 2018-05-15: qty 1

## 2018-05-15 MED ORDER — PREDNISONE 10 MG (21) PO TBPK: ORAL_TABLET | ORAL | 0 refills | Status: DC

## 2018-05-15 MED ORDER — CYCLOBENZAPRINE HCL 10 MG PO TABS: 10.0000 mg | ORAL_TABLET | Freq: Three times a day (TID) | ORAL | 0 refills | Status: DC | PRN

## 2018-05-15 NOTE — Discharge Instructions (Addendum)
Follow-up with your regular doctor.  Please call for an appointment.  You may actually need physical therapy to help you with your low back pain.  Take medications as prescribed.

## 2018-05-15 NOTE — ED Provider Notes (Signed)
[facility] Emergency Department Provider Note  ____________________________________________   First MD Initiated Contact with Patient 05/15/18 [phone]     (approximate)  I have reviewed the triage vital signs and the nursing notes.   HISTORY  Chief Complaint Leg Pain (left)    HPI Tracie Mcdaniel is a 48 y.o. female  C/o low back pain for her than 30 day, no known injury, pain is worse with movement, increased with bending over, denies numbness, tingling, or changes in bowel/urinary habits,  Using otc meds without relief, states pain radiates down the left leg to her foot.  She had an MRI done on Friday via her PCP.  She takes medications for fibromyalgia also. Remainder ros neg   Past Medical History:  Diagnosis Date  . Anxiety   . Bulging lumbar disc    x3  . Emphysema of lung (HCC)   . Fibromyalgia   . Osteoarthritis   . Osteoporosis     There are no active problems to display for this patient.   Past Surgical History:  Procedure Laterality Date  . ABDOMINAL HYSTERECTOMY      Prior to Admission medications   Medication Sig Start Date End Date Taking? Authorizing Provider  acetaminophen-codeine (TYLENOL #2) 300-15 MG tablet Take 1 tablet by mouth every 4 (four) hours as needed for moderate pain.    [provider]  albuterol (PROVENTIL HFA;VENTOLIN HFA) 108 (90 Base) MCG/ACT inhaler Inhale 2 puffs into the lungs every 6 (six) hours as needed for wheezing or shortness of breath.    [provider]  albuterol (PROVENTIL) (5 MG/ML) 0.5% nebulizer solution Take 2.5 mg by nebulization every 6 (six) hours as needed for wheezing or shortness of breath.    [provider]  ALPRAZolam Prudy Feeler(XANAX) 1 MG tablet Take 1 mg by mouth every 6 (six) hours as needed for anxiety.    [provider]  amitriptyline (ELAVIL) 100 MG tablet Take 100 mg by mouth at bedtime.    [provider]  azithromycin (ZITHROMAX Z-PAK) 250 MG  tablet 1 tab daily by mouth starting on 04/18/2017 04/17/17   Sharyn CreamerQuale, Mark, MD  butalbital-acetaminophen-caffeine (FIORICET, ESGIC) [phone] MG tablet Take 1 tablet by mouth every 6 (six) hours as needed for migraine.    [provider]  cyclobenzaprine (FLEXERIL) 10 MG tablet Take 1 tablet (10 mg total) by mouth 3 (three) times daily as needed. 05/15/18   , Roselyn BeringSusan W, PA-C  diclofenac sodium (VOLTAREN) 1 % GEL Apply 2 g topically 4 (four) times daily as needed (for pain).    [provider]  Fluticasone-Salmeterol (ADVAIR) 500-50 MCG/DOSE AEPB Inhale 1 puff into the lungs 2 (two) times daily.    [provider]  gabapentin (NEURONTIN) 300 MG capsule Take 1,200 mg by mouth 3 (three) times daily.    [provider]  HYDROcodone-acetaminophen (NORCO/VICODIN) 5-325 MG tablet Take 1 tablet by mouth every 6 (six) hours as needed for moderate pain. 05/15/18   , Roselyn BeringSusan W, PA-C  lactulose (CHRONULAC) 10 GM/15ML solution Take 10 g by mouth daily as needed.     [provider]  meloxicam (MOBIC) 15 MG tablet Take 1 tablet (15 mg total) by mouth daily. 06/14/17   Cuthriell, Delorise RoyalsJonathan D, PA-C  methocarbamol (ROBAXIN) 500 MG tablet Take 1 tablet (500 mg total) by mouth 4 (four) times daily. 06/14/17   Cuthriell, Delorise RoyalsJonathan D, PA-C  omeprazole (PRILOSEC) 20 MG capsule Take 20 mg by mouth 2 (two) times  daily before a meal.    [provider]  predniSONE (STERAPRED UNI-PAK 21 TAB) 10 MG (21) TBPK tablet Take 6 pills on day one then decrease by 1 pill each day 05/15/18   Faythe GheeFisher,  W, PA-C  promethazine (PHENERGAN) 12.5 MG tablet Take 12.5 mg by mouth every 6 (six) hours as needed for nausea or vomiting.    [provider]  sertraline (ZOLOFT) 100 MG tablet Take 250 mg by mouth daily.     [provider]  tiotropium (SPIRIVA) 18 MCG inhalation capsule Place 18 mcg into inhaler and inhale daily.    [provider]  topiramate (TOPAMAX)  50 MG tablet Take 150 mg by mouth 2 (two) times daily.    [provider]  triamcinolone ointment (KENALOG) 0.5 % Apply 1 application topically 2 (two) times daily as needed (for itching).    [provider]  zolpidem (AMBIEN) 10 MG tablet Take 10 mg by mouth at bedtime as needed for sleep.    [provider]    Allergies Ivp dye [iodinated diagnostic agents]  History reviewed. No pertinent family history.  Social History Social History   Tobacco Use  . Smoking status: Current Every Day Smoker    Packs/day: 0.50    Types: Cigarettes  . Smokeless tobacco: Never Used  Substance Use Topics  . Alcohol use: Yes    Comment: occ  . Drug use: No    Review of Systems  Constitutional: No fever/chills Eyes: No visual changes. ENT: No sore throat. Respiratory: Denies cough Genitourinary: Negative for dysuria. Musculoskeletal: Positive for back pain. Skin: Negative for rash.    ____________________________________________   PHYSICAL EXAM:  VITAL SIGNS: ED Triage Vitals [05/15/18 0908]  Enc Vitals Group     BP 113/77     Pulse Rate 91     Resp 16     Temp 98.4 F (36.9 C)     Temp Source Oral     SpO2 99 %     Weight 128 lb (58.1 kg)     Height 5\' 3"  (1.6 m)     Head Circumference      Peak Flow      Pain Score 8     Pain Loc      Pain Edu?      Excl. in GC?     Constitutional: Alert and oriented. Well appearing and in no acute distress. Eyes: Conjunctivae are normal.  Head: Atraumatic. Nose: No congestion/rhinnorhea. Mouth/Throat: Mucous membranes are moist.   Neck:  supple no lymphadenopathy noted Cardiovascular: Normal rate, regular rhythm. Heart sounds are normal Respiratory: Normal respiratory effort.  No retractions, lungs c t a  Abd: soft nontender bs normal all 4 quad GU: deferred Musculoskeletal: FROM all extremities, warm and well perfused.  Decreased rom of back due to discomfort, lumbar spine minimally tender, positive  Slr, 5/5 strength in great toes b/l, 5/5 strength in lower legs, n/v intact Neurologic:  Normal speech and language.  Skin:  Skin is warm, dry and intact. No rash noted. Psychiatric: Mood and affect are normal. Speech and behavior are normal.  ____________________________________________   LABS (all labs ordered are listed, but only abnormal results are displayed)  Labs Reviewed - No data to display ____________________________________________   ____________________________________________  RADIOLOGY    ____________________________________________   PROCEDURES  Procedure(s) performed: Toradol 30 mg IM, Flexeril 10 mg p.o., Percocet 1 p.o.  Procedures    ____________________________________________   INITIAL IMPRESSION / ASSESSMENT AND PLAN /  ED COURSE  Pertinent labs & imaging results that were available during my care of the patient were reviewed by me and considered in my medical decision making (see chart for details).   Patient is 48 year old female presents emergency department complaining of back pain.  Physical exam shows patient is uncomfortable.  Lumbar spine is minimally tender.  Remainder the exam is basically unremarkable  Patient was given Toradol 30 mg IM, Flexeril 10 mg p.o., Percocet 1 p.o.  She reports she got little relief from this.  She agrees that the steroid may help more than the other medications.  She states that she will "just deal with it ".  She is requesting to be discharged as her ride is available.  She is to follow-up with her regular doctor.  MRI results did not show anything surgical.  Explained all of this to the patient.  She was given a prescription for Sterapred, Flexeril, and Vicodin.  Explained to her that she may actually need physical therapy to help.  She states she understands will comply.  She is discharged in stable condition.     As part of my medical decision making, I reviewed the following data within the electronic  MEDICAL RECORD NUMBER Nursing notes reviewed and incorporated, Old chart reviewed, Notes from prior ED visits and [place] Controlled Substance Database  ____________________________________________   FINAL CLINICAL IMPRESSION(S) / ED DIAGNOSES  Final diagnoses:  Acute midline low back pain with left-sided sciatica      NEW MEDICATIONS STARTED DURING THIS VISIT:  New Prescriptions   CYCLOBENZAPRINE (FLEXERIL) 10 MG TABLET    Take 1 tablet (10 mg total) by mouth 3 (three) times daily as needed.   HYDROCODONE-ACETAMINOPHEN (NORCO/VICODIN) 5-325 MG TABLET    Take 1 tablet by mouth every 6 (six) hours as needed for moderate pain.   PREDNISONE (STERAPRED UNI-PAK 21 TAB) 10 MG (21) TBPK TABLET    Take 6 pills on day one then decrease by 1 pill each day     Note:  This document was prepared using Dragon voice recognition software and may include unintentional dictation errors.     Faythe Ghee, PA-C 05/15/18 1131    Rockne Menghini, MD 05/15/18 1520

## 2018-05-15 NOTE — ED Triage Notes (Signed)
Patient c/o left leg pain. Reports pain starts at bottom left back and radiates down. Ambulatory with limp in triage. Reports having MRI on back Friday with no results yet. A&O x4 in triage. Patient states "It will hurt some and then it won't"

## 2018-05-19 ENCOUNTER — Emergency Department
Admission: EM | Admit: 2018-05-19 | Discharge: 2018-05-19 | Disposition: A | Discharge status: Home / Self Care | Payer: Medicare Other | Attending: Emergency Medicine | Admitting: Emergency Medicine

## 2018-05-19 ENCOUNTER — Other Ambulatory Visit: Payer: Self-pay

## 2018-05-19 DIAGNOSIS — K529 Noninfective gastroenteritis and colitis, unspecified: Secondary | ICD-10-CM | POA: Diagnosis not present

## 2018-05-19 DIAGNOSIS — F17210 Nicotine dependence, cigarettes, uncomplicated: Secondary | ICD-10-CM | POA: Diagnosis not present

## 2018-05-19 DIAGNOSIS — R112 Nausea with vomiting, unspecified: Secondary | ICD-10-CM

## 2018-05-19 DIAGNOSIS — R1084 Generalized abdominal pain: Secondary | ICD-10-CM | POA: Diagnosis present

## 2018-05-19 DIAGNOSIS — Z79899 Other long term (current) drug therapy: Secondary | ICD-10-CM | POA: Diagnosis not present

## 2018-05-19 DIAGNOSIS — R197 Diarrhea, unspecified: Secondary | ICD-10-CM

## 2018-05-19 LAB — LACTIC ACID, PLASMA: Lactic Acid, Venous: 1.3 mmol/L (ref 0.5–1.9)

## 2018-05-19 LAB — URINALYSIS, COMPLETE (UACMP) WITH MICROSCOPIC
BILIRUBIN URINE: NEGATIVE
Bacteria, UA: NONE SEEN
Glucose, UA: NEGATIVE mg/dL
KETONES UR: NEGATIVE mg/dL
LEUKOCYTES UA: NEGATIVE
NITRITE: NEGATIVE
PH: 6 (ref 5.0–8.0)
PROTEIN: NEGATIVE mg/dL
Specific Gravity, Urine: 1.013 (ref 1.005–1.030)

## 2018-05-19 LAB — COMPREHENSIVE METABOLIC PANEL
ALT: 30 U/L (ref 0–44)
ANION GAP: 6 (ref 5–15)
AST: 26 U/L (ref 15–41)
Albumin: 3.7 g/dL (ref 3.5–5.0)
Alkaline Phosphatase: 72 U/L (ref 38–126)
BILIRUBIN TOTAL: 0.3 mg/dL (ref 0.3–1.2)
BUN: 8 mg/dL (ref 6–20)
CO2: 26 mmol/L (ref 22–32)
Calcium: 8.4 mg/dL — ABNORMAL LOW (ref 8.9–10.3)
Chloride: 110 mmol/L (ref 98–111)
Creatinine, Ser: 0.78 mg/dL (ref 0.44–1.00)
GFR calc non Af Amer: >60 mL/min (ref >60)
Glucose, Bld: 90 mg/dL (ref 70–99)
POTASSIUM: 3.7 mmol/L (ref 3.5–5.1)
SODIUM: 142 mmol/L (ref 135–145)
TOTAL PROTEIN: 6.5 g/dL (ref 6.5–8.1)

## 2018-05-19 LAB — LIPASE, BLOOD: Lipase: 23 U/L (ref 11–51)

## 2018-05-19 LAB — CBC
HCT: 37.6 % (ref 36.0–46.0)
HEMOGLOBIN: 12.6 g/dL (ref 12.0–15.0)
MCH: 32.5 pg (ref 26.0–34.0)
MCHC: 33.5 g/dL (ref 30.0–36.0)
MCV: 96.9 fL (ref 80.0–100.0)
NRBC: 0 % (ref 0.0–0.2)
PLATELETS: 206 10*3/uL (ref 150–400)
RBC: 3.88 MIL/uL (ref 3.87–5.11)
RDW: 12.3 % (ref 11.5–15.5)
WBC: 7.9 10*3/uL (ref 4.0–10.5)

## 2018-05-19 LAB — INFLUENZA PANEL BY PCR (TYPE A & B)
Influenza A By PCR: NEGATIVE
Influenza B By PCR: NEGATIVE

## 2018-05-19 MED ORDER — SODIUM CHLORIDE 0.9 % IV BOLUS: 1000.0000 mL | Freq: Once | INTRAVENOUS | Status: DC

## 2018-05-19 MED ORDER — PROMETHAZINE HCL 25 MG/ML IJ SOLN
25.0000 mg | Freq: Once | INTRAMUSCULAR | Status: AC
  Administered 2018-05-19: 25 mg via INTRAVENOUS
  Filled 2018-05-19: qty 1

## 2018-05-19 MED ORDER — KETOROLAC TROMETHAMINE 30 MG/ML IJ SOLN
15.0000 mg | INTRAMUSCULAR | Status: AC
  Administered 2018-05-19: 15 mg via INTRAVENOUS
  Filled 2018-05-19: qty 1

## 2018-05-19 MED ORDER — ONDANSETRON HCL 4 MG/2ML IJ SOLN
4.0000 mg | Freq: Once | INTRAMUSCULAR | Status: AC
  Administered 2018-05-19: 4 mg via INTRAVENOUS
  Filled 2018-05-19: qty 2

## 2018-05-19 NOTE — ED Notes (Signed)
Pt states that nausea and pain medication not helping. MD told at this time.

## 2018-05-19 NOTE — Discharge Instructions (Addendum)
Continue the Augmentin that was prescribed to previously, and continue taking your nausea medicines.  Please follow-up with your doctors at [facility] as planned.

## 2018-05-19 NOTE — ED Provider Notes (Signed)
[facility] Emergency Department Provider Note  ____________________________________________  Time seen: Approximately 7:41 PM  I have reviewed the triage vital signs and the nursing notes.   HISTORY  Chief Complaint Abdominal Pain    HPI Tracie Mcdaniel is a 48 y.o. female with a history of anxiety emphysema fibromyalgia chronic back pain who complains of generalized abdominal pain with vomiting and diarrhea  abdominal pain radiates to her back in the area of her chronic back pain.  Inability to take anything by mouth today.  Also notes recent urinary retention diagnosed and Foley catheter placed 2 days ago.  Review of controlled substance reporting system shows multiple opioid prescriptions and sedative prescriptions.  Electronic medical record review shows that there was a phone conversation with her doctor at [facility] yesterday morning where they note that she often is complaining of severe abdominal pain and they had wanted to see her in same day clinic yesterday, but she told him she preferred to go to the ER.  She was seen at [facility] ED yesterday, which was her fourth visit in 4 days.  She had labs which were reassuring, CT scan which showed improving colitis for which she had previously been prescribed Augmentin.  They discussed with her internal medicine doctor who knows her well and sees her weekly.  She was given Zofran for nausea.  Patient reports she also has Phenergan at home.     Past Medical History:  Diagnosis Date  . Anxiety   . Bulging lumbar disc    x3  . Emphysema of lung (HCC)   . Fibromyalgia   . Osteoarthritis   . Osteoporosis      There are no active problems to display for this patient.    Past Surgical History:  Procedure Laterality Date  . ABDOMINAL HYSTERECTOMY       Prior to Admission medications   Medication Sig Start Date End Date Taking? Authorizing Provider  acetaminophen-codeine (TYLENOL #2) 300-15 MG tablet Take 1 tablet  by mouth every 4 (four) hours as needed for moderate pain.    [provider]  albuterol (PROVENTIL HFA;VENTOLIN HFA) 108 (90 Base) MCG/ACT inhaler Inhale 2 puffs into the lungs every 6 (six) hours as needed for wheezing or shortness of breath.    [provider]  albuterol (PROVENTIL) (5 MG/ML) 0.5% nebulizer solution Take 2.5 mg by nebulization every 6 (six) hours as needed for wheezing or shortness of breath.    [provider]  ALPRAZolam Prudy Feeler) 1 MG tablet Take 1 mg by mouth every 6 (six) hours as needed for anxiety.    [provider]  amitriptyline (ELAVIL) 100 MG tablet Take 100 mg by mouth at bedtime.    [provider]  azithromycin (ZITHROMAX Z-PAK) 250 MG tablet 1 tab daily by mouth starting on 04/18/2017 04/17/17   Sharyn Creamer, MD  butalbital-acetaminophen-caffeine (FIORICET, ESGIC) [phone] MG tablet Take 1 tablet by mouth every 6 (six) hours as needed for migraine.    [provider]  cyclobenzaprine (FLEXERIL) 10 MG tablet Take 1 tablet (10 mg total) by mouth 3 (three) times daily as needed. 05/15/18   Fisher, Roselyn Bering, PA-C  diclofenac sodium (VOLTAREN) 1 % GEL Apply 2 g topically 4 (four) times daily as needed (for pain).    [provider]  Fluticasone-Salmeterol (ADVAIR) 500-50 MCG/DOSE AEPB Inhale 1 puff into the lungs 2 (two) times daily.    [provider]  gabapentin (NEURONTIN) 300 MG capsule Take 1,200 mg  by mouth 3 (three) times daily.    [provider]  HYDROcodone-acetaminophen (NORCO/VICODIN) 5-325 MG tablet Take 1 tablet by mouth every 6 (six) hours as needed for moderate pain. 05/15/18   Fisher, Roselyn BeringSusan W, PA-C  lactulose (CHRONULAC) 10 GM/15ML solution Take 10 g by mouth daily as needed.     [provider]  meloxicam (MOBIC) 15 MG tablet Take 1 tablet (15 mg total) by mouth daily. 06/14/17   Cuthriell, Delorise RoyalsJonathan D, PA-C  methocarbamol (ROBAXIN) 500 MG tablet Take 1 tablet (500 mg  total) by mouth 4 (four) times daily. 06/14/17   Cuthriell, Delorise RoyalsJonathan D, PA-C  omeprazole (PRILOSEC) 20 MG capsule Take 20 mg by mouth 2 (two) times daily before a meal.    [provider]  predniSONE (STERAPRED UNI-PAK 21 TAB) 10 MG (21) TBPK tablet Take 6 pills on day one then decrease by 1 pill each day 05/15/18   Faythe GheeFisher, Susan W, PA-C  promethazine (PHENERGAN) 12.5 MG tablet Take 12.5 mg by mouth every 6 (six) hours as needed for nausea or vomiting.    [provider]  sertraline (ZOLOFT) 100 MG tablet Take 250 mg by mouth daily.     [provider]  tiotropium (SPIRIVA) 18 MCG inhalation capsule Place 18 mcg into inhaler and inhale daily.    [provider]  topiramate (TOPAMAX) 50 MG tablet Take 150 mg by mouth 2 (two) times daily.    [provider]  triamcinolone ointment (KENALOG) 0.5 % Apply 1 application topically 2 (two) times daily as needed (for itching).    [provider]  zolpidem (AMBIEN) 10 MG tablet Take 10 mg by mouth at bedtime as needed for sleep.    [provider]     Allergies Ivp dye [iodinated diagnostic agents]   History reviewed. No pertinent family history.  Social History Social History   Tobacco Use  . Smoking status: Current Every Day Smoker    Packs/day: 0.50    Types: Cigarettes  . Smokeless tobacco: Never Used  Substance Use Topics  . Alcohol use: Yes    Comment: occ  . Drug use: No    Review of Systems  Constitutional:   No fever or chills.  ENT:   No sore throat. No rhinorrhea. Cardiovascular:   No chest pain or syncope. Respiratory:   No dyspnea or cough. Gastrointestinal:   Positive as above for abdominal pain, vomiting and diarrhea.  Musculoskeletal:   Negative for focal pain or swelling All other systems reviewed and are negative except as documented above in ROS and HPI.  ____________________________________________   PHYSICAL EXAM:  VITAL SIGNS: ED Triage Vitals  [05/19/18 1625]  Enc Vitals Group     BP 126/74     Pulse Rate 91     Resp 20     Temp 98.4 F (36.9 C)     Temp Source Oral     SpO2 100 %     Weight 128 lb (58.1 kg)     Height 5\' 4"  (1.626 m)     Head Circumference      Peak Flow      Pain Score 10     Pain Loc      Pain Edu?      Excl. in GC?     Vital signs reviewed, nursing assessments reviewed.   Constitutional:   Alert and oriented. Non-toxic appearance. Eyes:   Conjunctivae are normal. EOMI. PERRL. ENT      Head:  Normocephalic and atraumatic.      Nose:   No congestion/rhinnorhea.       Mouth/Throat:   MMM, no pharyngeal erythema. No peritonsillar mass.       Neck:   No meningismus. Full ROM. Hematological/Lymphatic/Immunilogical:   No cervical lymphadenopathy. Cardiovascular:   RRR. Symmetric bilateral radial and DP pulses.  No murmurs. Cap refill less than 2 seconds. Respiratory:   Normal respiratory effort without tachypnea/retractions. Breath sounds are clear and equal bilaterally. No wheezes/rales/rhonchi. Gastrointestinal:   Soft and nontender. Non distended. There is no CVA tenderness.  No rebound, rigidity, or guarding. Musculoskeletal:   Normal range of motion in all extremities. No joint effusions.  No lower extremity tenderness.  No edema. Neurologic:   Normal speech and language.  Motor grossly intact. No acute focal neurologic deficits are appreciated.  Skin:    Skin is warm, dry and intact. No rash noted.  No petechiae, purpura, or bullae.  ____________________________________________    LABS (pertinent positives/negatives) (all labs ordered are listed, but only abnormal results are displayed) Labs Reviewed  COMPREHENSIVE METABOLIC PANEL - Abnormal; Notable for the following components:      Result Value   Calcium 8.4 (*)    All other components within normal limits  URINALYSIS, COMPLETE (UACMP) WITH MICROSCOPIC - Abnormal; Notable for the following components:   Color, Urine YELLOW (*)     APPearance CLEAR (*)    Hgb urine dipstick SMALL (*)    All other components within normal limits  LIPASE, BLOOD  CBC  LACTIC ACID, PLASMA  INFLUENZA PANEL BY PCR (TYPE A & B)   ____________________________________________   EKG    ____________________________________________    RADIOLOGY  No results found.  ____________________________________________   PROCEDURES Procedures  ____________________________________________    CLINICAL IMPRESSION / ASSESSMENT AND PLAN / ED COURSE  Pertinent labs & imaging results that were available during my care of the patient were reviewed by me and considered in my medical decision making (see chart for details).    Patient presents with generalized abdominal pain vomiting and diarrhea consistent with a viral gastroenteritis.  [facility] and is undergoing treatment for that.  No reason to reimage today given that she had a CT scan yesterday and vital signs labs and exam are all reassuring today.   Clinical Course as of May 19 2001  Sat May 19, 2018  1919 Labs and vitals all unremarkable.  Waiting on flu test and symptomatic response to IV fluids and antiemetics.   [PS]    Clinical Course User Index [PS] Sharman CheekStafford, Zyanna Leisinger, MD    ----------------------------------------- 8:01 PM on 05/19/2018 ----------------------------------------- Based on [facility], it seems that the patient's symptoms are a combination of chronic, exacerbated by her anxiety disorder, and related to colitis that is already being treated.  With her multiple opioid and sedative medication prescriptions in the controlled substance reporting system, I wonder if there could be an element of mild medication withdrawal as well.  She is nontoxic, overall exam is reassuring vital signs are normal.  While waiting for symptoms to improve, patient indicated that she wished to be discharged immediately, and I do believe she is  medically stable and has medical decision-making capacity and suitable for outpatient follow-up as [facility] who has involve case management as well.   ____________________________________________   FINAL CLINICAL IMPRESSION(S) / ED DIAGNOSES    Final diagnoses:  Generalized abdominal pain  Nausea vomiting  and diarrhea  Colitis     ED Discharge Orders    None      Portions of this note were generated with dragon dictation software. Dictation errors may occur despite best attempts at proofreading.   Sharman Cheek, MD 05/19/18 2003

## 2018-05-19 NOTE — ED Notes (Signed)
Patient states that her nausea has improved. Patient states that she continues to have pain. Dr. Scotty Court notified.

## 2018-05-19 NOTE — ED Notes (Addendum)
Pt was offered percocet and zofran. States zofran doesn't work. States "I can't take nothing by mouth." explained to pt that this RN wants to help pt with pain/nausea but pt stated "I don't want to be charged for something that isn't going to work."

## 2018-05-19 NOTE — ED Triage Notes (Signed)
Pt points to lower abd (bladder) with vomiting and diarrhea (states it's just straight blood, hx hemorrhoids) x few days. Had foley placed 2 days ago for retention. Chronic oxygen user. States pain wraps around to back. A&O, in wheelchair.

## 2018-05-19 NOTE — ED Notes (Signed)
First Nurse Note: Pt c/o N/V. Pt in NAD

## 2019-07-01 ENCOUNTER — Emergency Department: Payer: Medicare Other

## 2019-07-01 ENCOUNTER — Other Ambulatory Visit: Payer: Self-pay

## 2019-07-01 ENCOUNTER — Emergency Department
Admission: EM | Admit: 2019-07-01 | Discharge: 2019-07-01 | Disposition: A | Discharge status: Home / Self Care | Payer: Medicare Other | Attending: Emergency Medicine | Admitting: Emergency Medicine

## 2019-07-01 ENCOUNTER — Encounter: Payer: Self-pay | Admitting: Emergency Medicine

## 2019-07-01 DIAGNOSIS — Z79899 Other long term (current) drug therapy: Secondary | ICD-10-CM | POA: Insufficient documentation

## 2019-07-01 DIAGNOSIS — R1010 Upper abdominal pain, unspecified: Secondary | ICD-10-CM

## 2019-07-01 DIAGNOSIS — N200 Calculus of kidney: Secondary | ICD-10-CM

## 2019-07-01 DIAGNOSIS — R1011 Right upper quadrant pain: Secondary | ICD-10-CM | POA: Diagnosis not present

## 2019-07-01 DIAGNOSIS — F17210 Nicotine dependence, cigarettes, uncomplicated: Secondary | ICD-10-CM | POA: Insufficient documentation

## 2019-07-01 LAB — URINALYSIS, COMPLETE (UACMP) WITH MICROSCOPIC
Bilirubin Urine: NEGATIVE
Glucose, UA: NEGATIVE mg/dL
Hgb urine dipstick: NEGATIVE
Ketones, ur: NEGATIVE mg/dL
Leukocytes,Ua: NEGATIVE
Nitrite: NEGATIVE
Protein, ur: NEGATIVE mg/dL
Specific Gravity, Urine: 1.004 — ABNORMAL LOW (ref 1.005–1.030)
pH: 6 (ref 5.0–8.0)

## 2019-07-01 LAB — COMPREHENSIVE METABOLIC PANEL
ALT: 80 U/L — ABNORMAL HIGH (ref 0–44)
AST: 80 U/L — ABNORMAL HIGH (ref 15–41)
Albumin: 4.2 g/dL (ref 3.5–5.0)
Alkaline Phosphatase: 89 U/L (ref 38–126)
Anion gap: 11 (ref 5–15)
BUN: 10 mg/dL (ref 6–20)
CO2: 24 mmol/L (ref 22–32)
Calcium: 9 mg/dL (ref 8.9–10.3)
Chloride: 103 mmol/L (ref 98–111)
Creatinine, Ser: 0.67 mg/dL (ref 0.44–1.00)
GFR calc Af Amer: >60 mL/min (ref >60)
GFR calc non Af Amer: >60 mL/min (ref >60)
Glucose, Bld: 94 mg/dL (ref 70–99)
Potassium: 3.8 mmol/L (ref 3.5–5.1)
Sodium: 138 mmol/L (ref 135–145)
Total Bilirubin: 0.7 mg/dL (ref 0.3–1.2)
Total Protein: 7.6 g/dL (ref 6.5–8.1)

## 2019-07-01 LAB — CBC
HCT: 46.2 % — ABNORMAL HIGH (ref 36.0–46.0)
Hemoglobin: 16 g/dL — ABNORMAL HIGH (ref 12.0–15.0)
MCH: 32.9 pg (ref 26.0–34.0)
MCHC: 34.6 g/dL (ref 30.0–36.0)
MCV: 94.9 fL (ref 80.0–100.0)
Platelets: 234 10*3/uL (ref 150–400)
RBC: 4.87 MIL/uL (ref 3.87–5.11)
RDW: 11.8 % (ref 11.5–15.5)
WBC: 7.9 10*3/uL (ref 4.0–10.5)
nRBC: 0 % (ref 0.0–0.2)

## 2019-07-01 LAB — LIPASE, BLOOD: Lipase: 28 U/L (ref 11–51)

## 2019-07-01 MED ORDER — PROMETHAZINE HCL 25 MG/ML IJ SOLN
12.5000 mg | Freq: Once | INTRAMUSCULAR | Status: AC
  Administered 2019-07-01: 12.5 mg via INTRAVENOUS
  Filled 2019-07-01: qty 1

## 2019-07-01 MED ORDER — DROPERIDOL 2.5 MG/ML IJ SOLN
2.5000 mg | Freq: Once | INTRAMUSCULAR | Status: AC
  Administered 2019-07-01: 2.5 mg via INTRAVENOUS
  Filled 2019-07-01: qty 2

## 2019-07-01 MED ORDER — ONDANSETRON HCL 4 MG/2ML IJ SOLN
4.0000 mg | Freq: Once | INTRAMUSCULAR | Status: AC
  Administered 2019-07-01: 4 mg via INTRAVENOUS
  Filled 2019-07-01: qty 2

## 2019-07-01 MED ORDER — HYDROMORPHONE HCL 1 MG/ML IJ SOLN
0.5000 mg | Freq: Once | INTRAMUSCULAR | Status: AC
  Administered 2019-07-01: 0.5 mg via INTRAVENOUS
  Filled 2019-07-01: qty 1

## 2019-07-01 MED ORDER — MORPHINE SULFATE (PF) 4 MG/ML IV SOLN
4.0000 mg | Freq: Once | INTRAVENOUS | Status: AC
  Administered 2019-07-01: 4 mg via INTRAVENOUS

## 2019-07-01 MED ORDER — MORPHINE SULFATE (PF) 4 MG/ML IV SOLN
INTRAVENOUS | Status: AC
  Filled 2019-07-01: qty 1

## 2019-07-01 MED ORDER — SODIUM CHLORIDE 0.9% FLUSH: 3.0000 mL | Freq: Once | INTRAVENOUS | Status: DC

## 2019-07-01 MED ORDER — MORPHINE SULFATE (PF) 4 MG/ML IV SOLN
4.0000 mg | Freq: Once | INTRAVENOUS | Status: AC
  Administered 2019-07-01: 4 mg via INTRAVENOUS
  Filled 2019-07-01: qty 1

## 2019-07-01 NOTE — ED Notes (Signed)
EDP at bedside  

## 2019-07-01 NOTE — ED Notes (Signed)
Urine specimen collected, sent to lab.

## 2019-07-01 NOTE — ED Notes (Signed)
Pt called out again with c/o nausea.

## 2019-07-01 NOTE — ED Notes (Signed)
Pt transported to US

## 2019-07-01 NOTE — ED Notes (Signed)
MD at bedside. 

## 2019-07-01 NOTE — ED Notes (Signed)
Recollect red green and blue sent to lab

## 2019-07-01 NOTE — ED Provider Notes (Signed)
-----------------------------------------   3:03 PM on 07/01/2019 -----------------------------------------  Blood pressure (!) 143/98, pulse 96, temperature 98.7 F (37.1 C), temperature source Oral, resp. rate 17, height 5\' 4"  (1.626 m), weight 71.7 kg, SpO2 95 %.  Assuming care from Dr. .  In short, Tracie Mcdaniel is a 49 y.o. female with a chief complaint of Abdominal Pain .  Refer to the original H&P for additional details.  The current plan of care is to follow-up CT results for RUQ pain.  ----------------------------------------- 4:57 PM on 07/01/2019 -----------------------------------------  CT scan significant for left proximal ureteral stone, however there are no signs of obstruction.  CT otherwise does not show any potential etiology for patient's right upper quadrant abdominal pain.  I do not suspect the kidney stone is the source of her pain given there is no hydronephrosis or other signs of inflammation, and all of her pain is on the right side of her abdomen.  Pain is reasonably well controlled at this time and patient tolerating p.o. without difficulty, she is requesting to be discharged home.  I have counseled her to follow-up with urology as needed for left flank or abdominal pain, she may also return to the ED for new or worsening symptoms.  She states she has nausea medicine available at home, patient agrees with plan.    08/31/2019, MD 07/01/19 [phone]

## 2019-07-01 NOTE — ED Triage Notes (Signed)
Pt here with RUQ pain that began a week ago but getting worse. Does have her gallbladder, hurts to take a deep breath, has had some nausea, denies vomiting and diarrhea. NAD. Is on home O2 all the time.

## 2019-07-01 NOTE — ED Notes (Addendum)
Pt called out d/t c/o nausea, pt reports taking phenergan at home as needed for nausea. pt toileted and repositioned for comfort.

## 2019-07-01 NOTE — ED Notes (Signed)
Pt placed on wall O2 at (chronic) 2L O2. Pt placed on VS monitor.   Pt c/o chest pain waking her up at 3-4am "every morning", pt states the pain comes on and goes suddenly.

## 2019-07-01 NOTE — ED Provider Notes (Signed)
[facility] Emergency Department Provider Note   ____________________________________________    I have reviewed the triage vital signs and the nursing notes.   HISTORY  Chief Complaint Abdominal Pain     HPI Tracie Mcdaniel is a 49 y.o. female with a history as noted below who presents with complaints of right upper quadrant abdominal pain.  Patient reports it has been hurting intermittently since Friday, seems to be worse in the morning.  Comes and goes and is at times very severe.  Has not take anything for this.  Reports the pain radiates to her shoulder blade on the right.  No fevers or chills.  No nausea or vomiting.  No shortness of breath.  Has a history of a hysterectomy, no history of peptic ulcer disease.  Past Medical History:  Diagnosis Date  . Anxiety   . Bulging lumbar disc    x3  . Emphysema of lung (HCC)   . Fibromyalgia   . Osteoarthritis   . Osteoporosis     There are no problems to display for this patient.   Past Surgical History:  Procedure Laterality Date  . ABDOMINAL HYSTERECTOMY      Prior to Admission medications   Medication Sig Start Date End Date Taking? Authorizing Provider  acetaminophen-codeine (TYLENOL #2) 300-15 MG tablet Take 1 tablet by mouth every 4 (four) hours as needed for moderate pain.    [provider]  albuterol (PROVENTIL HFA;VENTOLIN HFA) 108 (90 Base) MCG/ACT inhaler Inhale 2 puffs into the lungs every 6 (six) hours as needed for wheezing or shortness of breath.    [provider]  albuterol (PROVENTIL) (5 MG/ML) 0.5% nebulizer solution Take 2.5 mg by nebulization every 6 (six) hours as needed for wheezing or shortness of breath.    [provider]  ALPRAZolam Prudy Feeler) 1 MG tablet Take 1 mg by mouth every 6 (six) hours as needed for anxiety.    [provider]  amitriptyline (ELAVIL) 100 MG tablet Take 100 mg by mouth at bedtime.    [provider]    azithromycin (ZITHROMAX Z-PAK) 250 MG tablet 1 tab daily by mouth starting on 04/18/2017 04/17/17   Sharyn Creamer, MD  butalbital-acetaminophen-caffeine (FIORICET, ESGIC) [phone] MG tablet Take 1 tablet by mouth every 6 (six) hours as needed for migraine.    [provider]  cyclobenzaprine (FLEXERIL) 10 MG tablet Take 1 tablet (10 mg total) by mouth 3 (three) times daily as needed. 05/15/18   Fisher, Roselyn Bering, PA-C  diclofenac sodium (VOLTAREN) 1 % GEL Apply 2 g topically 4 (four) times daily as needed (for pain).    [provider]  Fluticasone-Salmeterol (ADVAIR) 500-50 MCG/DOSE AEPB Inhale 1 puff into the lungs 2 (two) times daily.    [provider]  gabapentin (NEURONTIN) 300 MG capsule Take 1,200 mg by mouth 3 (three) times daily.    [provider]  HYDROcodone-acetaminophen (NORCO/VICODIN) 5-325 MG tablet Take 1 tablet by mouth every 6 (six) hours as needed for moderate pain. 05/15/18   Fisher, Roselyn Bering, PA-C  lactulose (CHRONULAC) 10 GM/15ML solution Take 10 g by mouth daily as needed.     [provider]  meloxicam (MOBIC) 15 MG tablet Take 1 tablet (15 mg total) by mouth daily. 06/14/17   Cuthriell, Delorise Royals, PA-C  methocarbamol (ROBAXIN) 500 MG tablet Take 1 tablet (500 mg total) by mouth 4 (four) times daily. 06/14/17   Cuthriell, Delorise Royals, PA-C  omeprazole (PRILOSEC)  20 MG capsule Take 20 mg by mouth 2 (two) times daily before a meal.    [provider]  predniSONE (STERAPRED UNI-PAK 21 TAB) 10 MG (21) TBPK tablet Take 6 pills on day one then decrease by 1 pill each day 05/15/18   Faythe Ghee, PA-C  promethazine (PHENERGAN) 12.5 MG tablet Take 12.5 mg by mouth every 6 (six) hours as needed for nausea or vomiting.    [provider]  sertraline (ZOLOFT) 100 MG tablet Take 250 mg by mouth daily.     [provider]  tiotropium (SPIRIVA) 18 MCG inhalation capsule Place 18 mcg into inhaler and inhale daily.     [provider]  topiramate (TOPAMAX) 50 MG tablet Take 150 mg by mouth 2 (two) times daily.    [provider]  triamcinolone ointment (KENALOG) 0.5 % Apply 1 application topically 2 (two) times daily as needed (for itching).    [provider]  zolpidem (AMBIEN) 10 MG tablet Take 10 mg by mouth at bedtime as needed for sleep.    [provider]     Allergies Ivp dye [iodinated diagnostic agents]  No family history on file.  Social History Social History   Tobacco Use  . Smoking status: Current Every Day Smoker    Packs/day: 0.50    Types: Cigarettes  . Smokeless tobacco: Never Used  Substance Use Topics  . Alcohol use: Yes    Comment: occ  . Drug use: No    Review of Systems  Constitutional: No fever/chills Eyes: No visual changes.  ENT: No sore throat. Cardiovascular: Denies chest pain. Respiratory: Denies shortness of breath. Gastrointestinal: As above, normal stools Genitourinary: Negative for dysuria. Musculoskeletal: Negative for back pain. Skin: Negative for rash. Neurological: Negative for headaches or weakness   ____________________________________________   PHYSICAL EXAM:  VITAL SIGNS: ED Triage Vitals [07/01/19 1010]  Enc Vitals Group     BP (!) 149/101     Pulse Rate (!) 113     Resp 16     Temp 98.7 F (37.1 C)     Temp Source Oral     SpO2 97 %     Weight 71.7 kg (158 lb)     Height 1.626 m (5\' 4" )     Head Circumference      Peak Flow      Pain Score 10     Pain Loc      Pain Edu?      Excl. in GC?     Constitutional: Alert and oriented.  Nose: No congestion/rhinnorhea. Mouth/Throat: Mucous membranes are moist.    Cardiovascular: Normal rate, regular rhythm. Grossly normal heart sounds.  Good peripheral circulation. Respiratory: Normal respiratory effort.  No retractions. Lungs CTAB. Gastrointestinal: Mild tenderness palpation of the right upper quadrant, no distention, soft, no CVA  tenderness  Musculoskeletal:   Warm and well perfused Neurologic:  Normal speech and language. No gross focal neurologic deficits are appreciated.  Skin:  Skin is warm, dry and intact. No rash noted. Psychiatric: Mood and affect are normal. Speech and behavior are normal.  ____________________________________________   LABS (all labs ordered are listed, but only abnormal results are displayed)  Labs Reviewed  CBC - Abnormal; Notable for the following components:      Result Value   Hemoglobin 16.0 (*)    HCT 46.2 (*)    All other components within normal limits  URINALYSIS, COMPLETE (UACMP) WITH MICROSCOPIC - Abnormal; Notable for the following  components:   Color, Urine STRAW (*)    APPearance CLEAR (*)    Specific Gravity, Urine 1.004 (*)    Bacteria, UA RARE (*)    All other components within normal limits  COMPREHENSIVE METABOLIC PANEL - Abnormal; Notable for the following components:   AST 80 (*)    ALT 80 (*)    All other components within normal limits  LIPASE, BLOOD  POC URINE PREG, ED   ____________________________________________  EKG  ED ECG REPORT I, Lavonia Drafts, the attending physician, personally viewed and interpreted this ECG.  Date: 07/01/2019  Rhythm: normal sinus rhythm QRS Axis: normal Intervals: normal ST/T Wave abnormalities: normal Narrative Interpretation: no evidence of acute ischemia  ____________________________________________  RADIOLOGY  Ultrasound right upper quadrant ____________________________________________   PROCEDURES  Procedure(s) performed: No  Procedures   Critical Care performed: No ____________________________________________   INITIAL IMPRESSION / ASSESSMENT AND PLAN / ED COURSE  Pertinent labs & imaging results that were available during my care of the patient were reviewed by me and considered in my medical decision making (see chart for details).  Patient presents with complaints of right upper  quadrant abdominal pain suspicious for cholelithiasis versus cholecystitis.  Mild tenderness of the area.  Lab work thus far is reassuring.  Will send for ultrasound, will treat with IV morphine, IV Zofran.  Patient only mildly better after medications, will give additional morphine and additional Zofran  Ultrasound is overall unremarkable, will send for CT abdomen pelvis patient has contrast allergy, will order p.o. contrast only  IV Phenergan ordered   ____________________________________________   FINAL CLINICAL IMPRESSION(S) / ED DIAGNOSES  Final diagnoses:  Upper abdominal pain        Note:  This document was prepared using Dragon voice recognition software and may include unintentional dictation errors.   Lavonia Drafts, MD 07/01/19 [phone]

## 2019-07-01 NOTE — ED Notes (Signed)
Pt returned from CT °

## 2019-07-01 NOTE — ED Notes (Signed)
Pt taken to CT.

## 2019-07-01 NOTE — ED Notes (Signed)
Pt called out with C/o nausea, MD informed.

## 2019-07-01 NOTE — ED Notes (Signed)
This RN beside, pt reports pain and nausea decreased. Pt has finished drinking contrast, CT called.

## 2019-07-01 NOTE — ED Notes (Signed)
Pt forgot to pee in cup. Pt informed of needing sample when able. UA clicked off by mistake

## 2019-07-05 ENCOUNTER — Emergency Department
Admission: EM | Admit: 2019-07-05 | Discharge: 2019-07-05 | Disposition: A | Discharge status: Home / Self Care | Payer: Medicare Other | Attending: Emergency Medicine | Admitting: Emergency Medicine

## 2019-07-05 ENCOUNTER — Encounter: Payer: Self-pay | Admitting: Emergency Medicine

## 2019-07-05 ENCOUNTER — Other Ambulatory Visit: Payer: Self-pay

## 2019-07-05 DIAGNOSIS — Z5321 Procedure and treatment not carried out due to patient leaving prior to being seen by health care provider: Secondary | ICD-10-CM | POA: Diagnosis not present

## 2019-07-05 DIAGNOSIS — R109 Unspecified abdominal pain: Secondary | ICD-10-CM | POA: Diagnosis not present

## 2019-07-05 LAB — BASIC METABOLIC PANEL
Anion gap: 8 (ref 5–15)
BUN: 9 mg/dL (ref 6–20)
CO2: 25 mmol/L (ref 22–32)
Calcium: 9.3 mg/dL (ref 8.9–10.3)
Chloride: 105 mmol/L (ref 98–111)
Creatinine, Ser: 0.84 mg/dL (ref 0.44–1.00)
GFR calc Af Amer: >60 mL/min (ref >60)
GFR calc non Af Amer: >60 mL/min (ref >60)
Glucose, Bld: 115 mg/dL — ABNORMAL HIGH (ref 70–99)
Potassium: 3.9 mmol/L (ref 3.5–5.1)
Sodium: 138 mmol/L (ref 135–145)

## 2019-07-05 LAB — CBC
HCT: 42 % (ref 36.0–46.0)
Hemoglobin: 14.3 g/dL (ref 12.0–15.0)
MCH: 32.3 pg (ref 26.0–34.0)
MCHC: 34 g/dL (ref 30.0–36.0)
MCV: 94.8 fL (ref 80.0–100.0)
Platelets: 251 10*3/uL (ref 150–400)
RBC: 4.43 MIL/uL (ref 3.87–5.11)
RDW: 12.1 % (ref 11.5–15.5)
WBC: 11.9 10*3/uL — ABNORMAL HIGH (ref 4.0–10.5)
nRBC: 0 % (ref 0.0–0.2)

## 2019-07-05 MED ORDER — OXYCODONE-ACETAMINOPHEN 5-325 MG PO TABS
1.0000 | ORAL_TABLET | ORAL | Status: DC | PRN
  Administered 2019-07-05: 1 via ORAL
  Filled 2019-07-05: qty 1

## 2019-07-05 MED ORDER — ONDANSETRON 4 MG PO TBDP
4.0000 mg | ORAL_TABLET | Freq: Once | ORAL | Status: AC
  Administered 2019-07-05: 4 mg via ORAL
  Filled 2019-07-05: qty 1

## 2019-07-05 NOTE — ED Notes (Signed)
Pt given cup for urine sample 

## 2019-07-05 NOTE — ED Notes (Signed)
Pt to desk stating she is leaving, this RN verified that patient was not driving herself, pt states her husband just pulled up to driver her home. Pt states she is feeling better and not in as much pain. This RN explained patient received pain medication and pain could possibly return, pt states understanding and states if pain returns she will return, this RN explained if patinet LWBS and returned she would have to start process all over again. Pt states understanding. Ambulatory with steady gait out to car.

## 2019-07-05 NOTE — ED Triage Notes (Signed)
Pt to ED via POV c/o left flank pain that started yesterday. Pt states that this morning she started seeing blood in her urine. Pt reports that she was told recently that she had a kidney stone. Pt reports vomiting x 2 today.

## 2019-07-08 ENCOUNTER — Telehealth: Payer: Self-pay | Admitting: Emergency Medicine

## 2019-07-08 NOTE — H&P (View-Only) (Signed)
07/09/19 11:21 AM   Tracie Mcdaniel 08-Jun-1970 [id number]  Referring provider: Thea Alken, MD [id number] [facility] [address] [place],  [place] [address]  Chief Complaint  Patient presents with  . Nephrolithiasis    HPI: Tracie Mcdaniel is a 49 y.o. white Mcdaniel who presents today for the evaluation and management of nephrolithiasis after ED visit. She was referred to [place] by Tracie Alken, MD.   She had multiple ED visits recently for abdominal pain on 06/25/19, upper abdominal pain on 07/01/19 and flank pain, nausea and hematuria on 07/05/19.   She had a CT abdomen/pelvis on during her ED visit on 07/01/19 where a proximal left ureteral stone measuring 7 mm in greatest dimension was noted. Her [place] of abdomen limited RUQ showed fatty infiltration of the liver otherwise unremarkable.   Her UA from 07/01/19 showed 1.004 specific gravity and rare bacteria, otherwise negative.   She reports of right flank pain onset end of February and sudden left sided pain onset last Monday. She states that pain is different on each side. Her physician attributed pain potentially to gastritis. She reports of her pain being more painful than giving birth.   She also has been having intermittent gross hematuria episodes, urinary frequency, and N/V/C.   She had a UTI recently and he PCP prescribed Bactrim. She will be taking her last dose of Bactrim this evening.  This was called in over the weekend without a urine specimen.  No personal history of kidney stones. She has hx of bulging disc and hernia disc.   She denies use of blood thinners.   PMH: Past Medical History:  Diagnosis Date  . Anxiety   . Bulging lumbar disc    x3  . Emphysema of lung (HCC)   . Fibromyalgia   . Osteoarthritis   . Osteoporosis     Surgical History: Past Surgical History:  Procedure Laterality Date  . ABDOMINAL HYSTERECTOMY      Home Medications:  Allergies as of 07/09/2019      Reactions   Iodinated  Diagnostic Agents Anaphylaxis, Other (See Comments)   Some kind of contrast when they gave her heart cath//Reaction is : I died Some kind of contrast when they gave her heart cath//Reaction is : I died      Medication List       Accurate as of July 09, 2019 11:21 AM. If you have any questions, ask your nurse or doctor.        STOP taking these medications   azithromycin 250 MG tablet Commonly known as: Zithromax Z-Pak Stopped by: Vanna Scotland, MD   HYDROcodone-acetaminophen 5-325 MG tablet Commonly known as: NORCO/VICODIN Stopped by: Vanna Scotland, MD   predniSONE 10 MG (21) Tbpk tablet Commonly known as: STERAPRED UNI-PAK 21 TAB Stopped by: Vanna Scotland, MD     TAKE these medications   acetaminophen-codeine 300-15 MG tablet Commonly known as: TYLENOL #2 Take 1 tablet by mouth every 4 (four) hours as needed for moderate pain.   albuterol 108 (90 Base) MCG/ACT inhaler Commonly known as: VENTOLIN HFA Inhale 2 puffs into the lungs every 6 (six) hours as needed for wheezing or shortness of breath.   albuterol (5 MG/ML) 0.5% nebulizer solution Commonly known as: PROVENTIL Take 2.5 mg by nebulization every 6 (six) hours as needed for wheezing or shortness of breath.   ALPRAZolam 1 MG tablet Commonly known as: XANAX Take 1 mg by mouth every 6 (six) hours as  needed for anxiety.   amitriptyline 100 MG tablet Commonly known as: ELAVIL Take 100 mg by mouth at bedtime.   butalbital-acetaminophen-caffeine 50-325-40 MG tablet Commonly known as: FIORICET Take 1 tablet by mouth every 6 (six) hours as needed for migraine.   cyclobenzaprine 10 MG tablet Commonly known as: FLEXERIL Take 1 tablet (10 mg total) by mouth 3 (three) times daily as needed.   diclofenac sodium 1 % Gel Commonly known as: VOLTAREN Apply 2 g topically 4 (four) times daily as needed (for pain).   Fluticasone-Salmeterol 500-50 MCG/DOSE Aepb Commonly known as: ADVAIR Inhale 1 puff into the lungs 2  (two) times daily.   gabapentin 300 MG capsule Commonly known as: NEURONTIN Take 1,200 mg by mouth 3 (three) times daily.   ipratropium 17 MCG/ACT inhaler Commonly known as: ATROVENT HFA Inhale into the lungs.   lactulose 10 GM/15ML solution Commonly known as: CHRONULAC Take 10 g by mouth daily as needed.   meloxicam 15 MG tablet Commonly known as: MOBIC Take 1 tablet (15 mg total) by mouth daily.   methocarbamol 500 MG tablet Commonly known as: Robaxin Take 1 tablet (500 mg total) by mouth 4 (four) times daily.   omeprazole 20 MG capsule Commonly known as: PRILOSEC Take 20 mg by mouth 2 (two) times daily before a meal.   oxyCODONE-acetaminophen 5-325 MG tablet Commonly known as: PERCOCET/ROXICET Take 1 tablet by mouth every 4 (four) hours as needed. What changed: Another medication with the same name was added. Make sure you understand how and when to take each. Changed by: Vanna Scotland, MD   oxyCODONE-acetaminophen 5-325 MG tablet Commonly known as: Percocet Take 1-2 tablets by mouth every 4 (four) hours as needed for moderate pain or severe pain. What changed: You were already taking a medication with the same name, and this prescription was added. Make sure you understand how and when to take each. Changed by: Vanna Scotland, MD   promethazine 12.5 MG tablet Commonly known as: PHENERGAN Take 12.5 mg by mouth every 6 (six) hours as needed for nausea or vomiting.   sertraline 100 MG tablet Commonly known as: ZOLOFT Take 250 mg by mouth daily.   sulfamethoxazole-trimethoprim 800-160 MG tablet Commonly known as: BACTRIM DS Take by mouth.   tiotropium 18 MCG inhalation capsule Commonly known as: SPIRIVA Place 18 mcg into inhaler and inhale daily.   topiramate 50 MG tablet Commonly known as: TOPAMAX Take 150 mg by mouth 2 (two) times daily.   triamcinolone ointment 0.5 % Commonly known as: KENALOG Apply 1 application topically 2 (two) times daily as needed  (for itching).   zolpidem 10 MG tablet Commonly known as: AMBIEN Take 10 mg by mouth at bedtime as needed for sleep.       Allergies:  Allergies  Allergen Reactions  . Iodinated Diagnostic Agents Anaphylaxis and Other (See Comments)    Some kind of contrast when they gave her heart cath//Reaction is : I died Some kind of contrast when they gave her heart cath//Reaction is : I died     Family History: Family History  Problem Relation Age of Onset  . Bladder Cancer Mother   . Uterine cancer Mother     Social History:  reports that she has been smoking cigarettes. She has been smoking about 0.50 packs per day. She has never used smokeless tobacco. She reports current alcohol use. She reports that she does not use drugs.   Physical Exam: BP (!) 161/96   Pulse (!) 101  Ht 5' 4" (1.626 m)   Wt 156 lb (70.8 kg)   BMI 26.78 kg/m   Constitutional:  Alert and oriented, No acute distress. HEENT: [place] AT, moist mucus membranes.  Trachea midline, no masses. Cardiovascular: No clubbing, cyanosis, or edema. Respiratory: Normal respiratory effort, no increased work of breathing. Skin: No rashes, bruises or suspicious lesions. Neurologic: Grossly intact, no focal deficits, moving all 4 extremities. Psychiatric: Normal mood and affect.  Laboratory Data: UA indicated 3-10 RBC.   Pertinent Imaging: CLINICAL DATA:  Right upper quadrant pain for 2 days  EXAM: CT ABDOMEN AND PELVIS WITHOUT CONTRAST  TECHNIQUE: Multidetector CT imaging of the abdomen and pelvis was performed following the standard protocol without IV contrast.  COMPARISON:  Ultrasound from earlier in the same day.  FINDINGS: Lower chest: Dependent atelectatic changes are noted in the bases bilaterally. No sizable effusion is seen.  Hepatobiliary: No focal liver abnormality is seen. No gallstones, gallbladder wall thickening, or biliary dilatation.  Pancreas: Unremarkable. No pancreatic ductal dilatation  or surrounding inflammatory changes.  Spleen: Normal in size without focal abnormality.  Adrenals/Urinary Tract: Adrenal glands are within normal limits. Kidneys demonstrate a normal appearance bilaterally. No hydronephrosis is noted. A proximal left ureteral stone is seen without significant obstructive change. This measures approximately 7 mm in greatest dimension. The more distal left ureter is within normal limits. The bladder is well distended and unremarkable.  Stomach/Bowel: The appendix is well visualized within normal limits. No obstructive or inflammatory changes of the colon are seen. Small bowel is within normal limits. Stomach is decompressed.  Vascular/Lymphatic: Aortic atherosclerosis. No enlarged abdominal or pelvic lymph nodes.  Reproductive: Status post hysterectomy. No adnexal masses.  Other: No abdominal wall hernia or abnormality. No abdominopelvic ascites.  Musculoskeletal: No acute or significant osseous findings.  IMPRESSION: Proximal left ureteral stone measuring 7 mm in greatest dimension. No significant obstructive changes are seen. This may contribute to the patient's underlying discomfort.  No other focal abnormality is noted.   Electronically Signed   By: Mark  Lukens M.D.   On: 07/01/2019 15:56  CLINICAL DATA:  Upper abdominal pain, right upper quadrant pain  EXAM: ULTRASOUND ABDOMEN LIMITED RIGHT UPPER QUADRANT  COMPARISON:  None.  FINDINGS: Gallbladder:  No gallstones or wall thickening visualized. No sonographic Murphy sign noted by sonographer.  Common bile duct:  Diameter: Normal caliber, 4 mm  Liver:  Increased echotexture compatible with fatty infiltration. No focal abnormality or biliary ductal dilatation. Area of focal fatty sparing adjacent to the gallbladder fossa. Portal vein is patent on color Doppler imaging with normal direction of blood flow towards the liver.  Other:  None.  IMPRESSION: Fatty infiltration of the liver.  No acute findings.   Electronically Signed   By: Kevin  Dover M.D.   On: 07/01/2019 11:40  I have personally reviewed the images and agree with radiologist interpretation. Noted 7 mm ureteral stone not easily seen on scout image and 2 cm within renal shadow on scan.   Assessment & Plan:    1. Left ureteral calculus Most recent CT shows proximal ureteral stone measuring 7 mm  Discussed surveillance/medical exposure therapy vs intervention-desires intervention Morning symptoms emergent evaluation reviewed  We discussed various treatment options including ESWL vs. ureteroscopy, laser lithotripsy, and stent. We discussed the risks and benefits of both including bleeding, infection, damage to surrounding structures, efficacy with need for possible further intervention, and need for temporary ureteral stent. Pt elected to have ESWL given severe pain symptoms. Explained to   pt if she passes stone she would not need surgery.  Rx of Percocet (10 tablets) given severe pain  KUB today  2. Gross Hematuria Secondary to above  UA today positive for 3-10 RBC  Will re-evaluate after treatment of stone   [facility] [address], [place], [place] [address] [phone]  I, Lucas Mallow, am acting as a scribe for Dr. Hollice Espy,  I have reviewed the above documentation for accuracy and completeness, and I agree with the above.   Hollice Espy, MD

## 2019-07-08 NOTE — Progress Notes (Signed)
07/09/19 11:21 AM   Tracie Mcdaniel 08-Jun-1970 [id number]  Referring provider: Thea Alken, MD [id number] [facility] [address] [place],  [place] [address]  Chief Complaint  Patient presents with  . Nephrolithiasis    HPI: Tracie Mcdaniel is a 49 y.o. white F who presents today for the evaluation and management of nephrolithiasis after ED visit. She was referred to [place] by Tracie Alken, MD.   She had multiple ED visits recently for abdominal pain on 06/25/19, upper abdominal pain on 07/01/19 and flank pain, nausea and hematuria on 07/05/19.   She had a CT abdomen/pelvis on during her ED visit on 07/01/19 where a proximal left ureteral stone measuring 7 mm in greatest dimension was noted. Her [place] of abdomen limited RUQ showed fatty infiltration of the liver otherwise unremarkable.   Her UA from 07/01/19 showed 1.004 specific gravity and rare bacteria, otherwise negative.   She reports of right flank pain onset end of February and sudden left sided pain onset last Monday. She states that pain is different on each side. Her physician attributed pain potentially to gastritis. She reports of her pain being more painful than giving birth.   She also has been having intermittent gross hematuria episodes, urinary frequency, and N/V/C.   She had a UTI recently and he PCP prescribed Bactrim. She will be taking her last dose of Bactrim this evening.  This was called in over the weekend without a urine specimen.  No personal history of kidney stones. She has hx of bulging disc and hernia disc.   She denies use of blood thinners.   PMH: Past Medical History:  Diagnosis Date  . Anxiety   . Bulging lumbar disc    x3  . Emphysema of lung (HCC)   . Fibromyalgia   . Osteoarthritis   . Osteoporosis     Surgical History: Past Surgical History:  Procedure Laterality Date  . ABDOMINAL HYSTERECTOMY      Home Medications:  Allergies as of 07/09/2019      Reactions   Iodinated  Diagnostic Agents Anaphylaxis, Other (See Comments)   Some kind of contrast when they gave her heart cath//Reaction is : I died Some kind of contrast when they gave her heart cath//Reaction is : I died      Medication List       Accurate as of July 09, 2019 11:21 AM. If you have any questions, ask your nurse or doctor.        STOP taking these medications   azithromycin 250 MG tablet Commonly known as: Zithromax Z-Pak Stopped by: Vanna Scotland, MD   HYDROcodone-acetaminophen 5-325 MG tablet Commonly known as: NORCO/VICODIN Stopped by: Vanna Scotland, MD   predniSONE 10 MG (21) Tbpk tablet Commonly known as: STERAPRED UNI-PAK 21 TAB Stopped by: Vanna Scotland, MD     TAKE these medications   acetaminophen-codeine 300-15 MG tablet Commonly known as: TYLENOL #2 Take 1 tablet by mouth every 4 (four) hours as needed for moderate pain.   albuterol 108 (90 Base) MCG/ACT inhaler Commonly known as: VENTOLIN HFA Inhale 2 puffs into the lungs every 6 (six) hours as needed for wheezing or shortness of breath.   albuterol (5 MG/ML) 0.5% nebulizer solution Commonly known as: PROVENTIL Take 2.5 mg by nebulization every 6 (six) hours as needed for wheezing or shortness of breath.   ALPRAZolam 1 MG tablet Commonly known as: XANAX Take 1 mg by mouth every 6 (six) hours as  needed for anxiety.   amitriptyline 100 MG tablet Commonly known as: ELAVIL Take 100 mg by mouth at bedtime.   butalbital-acetaminophen-caffeine 50-325-40 MG tablet Commonly known as: FIORICET Take 1 tablet by mouth every 6 (six) hours as needed for migraine.   cyclobenzaprine 10 MG tablet Commonly known as: FLEXERIL Take 1 tablet (10 mg total) by mouth 3 (three) times daily as needed.   diclofenac sodium 1 % Gel Commonly known as: VOLTAREN Apply 2 g topically 4 (four) times daily as needed (for pain).   Fluticasone-Salmeterol 500-50 MCG/DOSE Aepb Commonly known as: ADVAIR Inhale 1 puff into the lungs 2  (two) times daily.   gabapentin 300 MG capsule Commonly known as: NEURONTIN Take 1,200 mg by mouth 3 (three) times daily.   ipratropium 17 MCG/ACT inhaler Commonly known as: ATROVENT HFA Inhale into the lungs.   lactulose 10 GM/15ML solution Commonly known as: CHRONULAC Take 10 g by mouth daily as needed.   meloxicam 15 MG tablet Commonly known as: MOBIC Take 1 tablet (15 mg total) by mouth daily.   methocarbamol 500 MG tablet Commonly known as: Robaxin Take 1 tablet (500 mg total) by mouth 4 (four) times daily.   omeprazole 20 MG capsule Commonly known as: PRILOSEC Take 20 mg by mouth 2 (two) times daily before a meal.   oxyCODONE-acetaminophen 5-325 MG tablet Commonly known as: PERCOCET/ROXICET Take 1 tablet by mouth every 4 (four) hours as needed. What changed: Another medication with the same name was added. Make sure you understand how and when to take each. Changed by: Vanna Scotland, MD   oxyCODONE-acetaminophen 5-325 MG tablet Commonly known as: Percocet Take 1-2 tablets by mouth every 4 (four) hours as needed for moderate pain or severe pain. What changed: You were already taking a medication with the same name, and this prescription was added. Make sure you understand how and when to take each. Changed by: Vanna Scotland, MD   promethazine 12.5 MG tablet Commonly known as: PHENERGAN Take 12.5 mg by mouth every 6 (six) hours as needed for nausea or vomiting.   sertraline 100 MG tablet Commonly known as: ZOLOFT Take 250 mg by mouth daily.   sulfamethoxazole-trimethoprim 800-160 MG tablet Commonly known as: BACTRIM DS Take by mouth.   tiotropium 18 MCG inhalation capsule Commonly known as: SPIRIVA Place 18 mcg into inhaler and inhale daily.   topiramate 50 MG tablet Commonly known as: TOPAMAX Take 150 mg by mouth 2 (two) times daily.   triamcinolone ointment 0.5 % Commonly known as: KENALOG Apply 1 application topically 2 (two) times daily as needed  (for itching).   zolpidem 10 MG tablet Commonly known as: AMBIEN Take 10 mg by mouth at bedtime as needed for sleep.       Allergies:  Allergies  Allergen Reactions  . Iodinated Diagnostic Agents Anaphylaxis and Other (See Comments)    Some kind of contrast when they gave her heart cath//Reaction is : I died Some kind of contrast when they gave her heart cath//Reaction is : I died     Family History: Family History  Problem Relation Age of Onset  . Bladder Cancer Mother   . Uterine cancer Mother     Social History:  reports that she has been smoking cigarettes. She has been smoking about 0.50 packs per day. She has never used smokeless tobacco. She reports current alcohol use. She reports that she does not use drugs.   Physical Exam: BP (!) 161/96   Pulse (!) 101  Ht 5\' 4"  (1.626 m)   Wt 156 lb (70.8 kg)   BMI 26.78 kg/m   Constitutional:  Alert and oriented, No acute distress. HEENT: [place] AT, moist mucus membranes.  Trachea midline, no masses. Cardiovascular: No clubbing, cyanosis, or edema. Respiratory: Normal respiratory effort, no increased work of breathing. Skin: No rashes, bruises or suspicious lesions. Neurologic: Grossly intact, no focal deficits, moving all 4 extremities. Psychiatric: Normal mood and affect.  Laboratory Data: UA indicated 3-10 RBC.   Pertinent Imaging: CLINICAL DATA:  Right upper quadrant pain for 2 days  EXAM: CT ABDOMEN AND PELVIS WITHOUT CONTRAST  TECHNIQUE: Multidetector CT imaging of the abdomen and pelvis was performed following the standard protocol without IV contrast.  COMPARISON:  Ultrasound from earlier in the same day.  FINDINGS: Lower chest: Dependent atelectatic changes are noted in the bases bilaterally. No sizable effusion is seen.  Hepatobiliary: No focal liver abnormality is seen. No gallstones, gallbladder wall thickening, or biliary dilatation.  Pancreas: Unremarkable. No pancreatic ductal dilatation  or surrounding inflammatory changes.  Spleen: Normal in size without focal abnormality.  Adrenals/Urinary Tract: Adrenal glands are within normal limits. Kidneys demonstrate a normal appearance bilaterally. No hydronephrosis is noted. A proximal left ureteral stone is seen without significant obstructive change. This measures approximately 7 mm in greatest dimension. The more distal left ureter is within normal limits. The bladder is well distended and unremarkable.  Stomach/Bowel: The appendix is well visualized within normal limits. No obstructive or inflammatory changes of the colon are seen. Small bowel is within normal limits. Stomach is decompressed.  Vascular/Lymphatic: Aortic atherosclerosis. No enlarged abdominal or pelvic lymph nodes.  Reproductive: Status post hysterectomy. No adnexal masses.  Other: No abdominal wall hernia or abnormality. No abdominopelvic ascites.  Musculoskeletal: No acute or significant osseous findings.  IMPRESSION: Proximal left ureteral stone measuring 7 mm in greatest dimension. No significant obstructive changes are seen. This may contribute to the patient's underlying discomfort.  No other focal abnormality is noted.   Electronically Signed   By: M.D.   On: 07/01/2019 15:56  CLINICAL DATA:  Upper abdominal pain, right upper quadrant pain  EXAM: ULTRASOUND ABDOMEN LIMITED RIGHT UPPER QUADRANT  COMPARISON:  None.  FINDINGS: Gallbladder:  No gallstones or wall thickening visualized. No sonographic Murphy sign noted by sonographer.  Common bile duct:  Diameter: Normal caliber, 4 mm  Liver:  Increased echotexture compatible with fatty infiltration. No focal abnormality or biliary ductal dilatation. Area of focal fatty sparing adjacent to the gallbladder fossa. Portal vein is patent on color Doppler imaging with normal direction of blood flow towards the liver.  Other:  None.  IMPRESSION: Fatty infiltration of the liver.  No acute findings.   Electronically Signed   By: 08/31/2019 M.D.   On: 07/01/2019 11:40  I have personally reviewed the images and agree with radiologist interpretation. Noted 7 mm ureteral stone not easily seen on scout image and 2 cm within renal shadow on scan.   Assessment & Plan:    1. Left ureteral calculus Most recent CT shows proximal ureteral stone measuring 7 mm  Discussed surveillance/medical exposure therapy vs intervention-desires intervention Morning symptoms emergent evaluation reviewed  We discussed various treatment options including ESWL vs. ureteroscopy, laser lithotripsy, and stent. We discussed the risks and benefits of both including bleeding, infection, damage to surrounding structures, efficacy with need for possible further intervention, and need for temporary ureteral stent. Pt elected to have ESWL given severe pain symptoms. Explained to  pt if she passes stone she would not need surgery.  Rx of Percocet (10 tablets) given severe pain  KUB today  2. Gross Hematuria Secondary to above  UA today positive for 3-10 RBC  Will re-evaluate after treatment of stone   [facility] [address], [place], [place] [address] [phone]  I, Lucas Mallow, am acting as a scribe for Dr. Hollice Espy,  I have reviewed the above documentation for accuracy and completeness, and I agree with the above.   Hollice Espy, MD

## 2019-07-08 NOTE — Telephone Encounter (Signed)
Called patient due to lwot to inquire about condition and follow up plans. She says she is not better, and in fact was up all night last night with pain.  Says she is drinking water, but has less urine.  I told her that she really needs to be checked again.  Says she made appt with pcp.later this week. I told her that she should call urology--gave her the number.   I told her that they may recommend she return here, but that she  Really needs to have her kidneys checked and kidney function. She will call them and return here if they are unable to make urgent appt.

## 2019-07-09 ENCOUNTER — Ambulatory Visit (INDEPENDENT_AMBULATORY_CARE_PROVIDER_SITE_OTHER): Payer: Medicare Other | Admitting: Urology

## 2019-07-09 ENCOUNTER — Other Ambulatory Visit: Payer: Self-pay | Admitting: Radiology

## 2019-07-09 ENCOUNTER — Other Ambulatory Visit: Payer: Self-pay

## 2019-07-09 ENCOUNTER — Ambulatory Visit
Admission: RE | Admit: 2019-07-09 | Discharge: 2019-07-09 | Disposition: A | Discharge status: Home / Self Care | Payer: Medicare Other | Source: Ambulatory Visit | Attending: Urology | Admitting: Urology

## 2019-07-09 ENCOUNTER — Encounter: Payer: Self-pay | Admitting: Urology

## 2019-07-09 ENCOUNTER — Other Ambulatory Visit
Admission: RE | Admit: 2019-07-09 | Discharge: 2019-07-09 | Disposition: A | Discharge status: Home / Self Care | Payer: Medicare Other | Source: Ambulatory Visit | Attending: Urology | Admitting: Urology

## 2019-07-09 VITALS — BP 161/96 | HR 101 | Ht 64.0 in | Wt 156.0 lb

## 2019-07-09 DIAGNOSIS — Z20822 Contact with and (suspected) exposure to covid-19: Secondary | ICD-10-CM | POA: Diagnosis not present

## 2019-07-09 DIAGNOSIS — N201 Calculus of ureter: Secondary | ICD-10-CM | POA: Diagnosis not present

## 2019-07-09 LAB — MICROSCOPIC EXAMINATION

## 2019-07-09 LAB — URINALYSIS, COMPLETE
Bilirubin, UA: NEGATIVE
Glucose, UA: NEGATIVE
Ketones, UA: NEGATIVE
Leukocytes,UA: NEGATIVE
Nitrite, UA: NEGATIVE
Protein,UA: NEGATIVE
Specific Gravity, UA: 1.01 (ref 1.005–1.030)
Urobilinogen, Ur: 0.2 mg/dL (ref 0.2–1.0)
pH, UA: 7 (ref 5.0–7.5)

## 2019-07-09 LAB — SARS CORONAVIRUS 2 (TAT 6-24 HRS): SARS Coronavirus 2: NEGATIVE

## 2019-07-09 MED ORDER — OXYCODONE-ACETAMINOPHEN 5-325 MG PO TABS: 1.0000 | ORAL_TABLET | ORAL | 0 refills | Status: DC | PRN

## 2019-07-11 ENCOUNTER — Encounter
Admission: RE | Disposition: A | Discharge status: Home / Self Care | Payer: Self-pay | Source: Ambulatory Visit | Attending: Urology

## 2019-07-11 ENCOUNTER — Ambulatory Visit: Payer: Medicare Other

## 2019-07-11 ENCOUNTER — Ambulatory Visit
Admission: RE | Admit: 2019-07-11 | Discharge: 2019-07-11 | Disposition: A | Discharge status: Home / Self Care | Payer: Medicare Other | Source: Ambulatory Visit | Attending: Urology | Admitting: Urology

## 2019-07-11 ENCOUNTER — Encounter: Payer: Self-pay | Admitting: Urology

## 2019-07-11 ENCOUNTER — Other Ambulatory Visit: Payer: Self-pay

## 2019-07-11 DIAGNOSIS — M797 Fibromyalgia: Secondary | ICD-10-CM | POA: Diagnosis not present

## 2019-07-11 DIAGNOSIS — Z888 Allergy status to other drugs, medicaments and biological substances status: Secondary | ICD-10-CM | POA: Insufficient documentation

## 2019-07-11 DIAGNOSIS — N202 Calculus of kidney with calculus of ureter: Secondary | ICD-10-CM | POA: Diagnosis present

## 2019-07-11 DIAGNOSIS — K760 Fatty (change of) liver, not elsewhere classified: Secondary | ICD-10-CM | POA: Insufficient documentation

## 2019-07-11 DIAGNOSIS — M1990 Unspecified osteoarthritis, unspecified site: Secondary | ICD-10-CM | POA: Diagnosis not present

## 2019-07-11 DIAGNOSIS — N132 Hydronephrosis with renal and ureteral calculous obstruction: Secondary | ICD-10-CM | POA: Insufficient documentation

## 2019-07-11 DIAGNOSIS — J439 Emphysema, unspecified: Secondary | ICD-10-CM | POA: Insufficient documentation

## 2019-07-11 DIAGNOSIS — N201 Calculus of ureter: Secondary | ICD-10-CM

## 2019-07-11 HISTORY — PX: EXTRACORPOREAL SHOCK WAVE LITHOTRIPSY: SHX1557

## 2019-07-11 SURGERY — LITHOTRIPSY, ESWL
Anesthesia: Moderate Sedation | Laterality: Left

## 2019-07-11 MED ORDER — CIPROFLOXACIN HCL 500 MG PO TABS
ORAL_TABLET | ORAL | Status: AC
  Administered 2019-07-11: 500 mg via ORAL
  Filled 2019-07-11: qty 1

## 2019-07-11 MED ORDER — OXYCODONE-ACETAMINOPHEN 5-325 MG PO TABS
1.0000 | ORAL_TABLET | Freq: Once | ORAL | Status: AC
  Administered 2019-07-11: 1 via ORAL

## 2019-07-11 MED ORDER — SODIUM CHLORIDE 0.9 % IV SOLN: INTRAVENOUS | Status: DC

## 2019-07-11 MED ORDER — OXYCODONE-ACETAMINOPHEN 5-325 MG PO TABS
ORAL_TABLET | ORAL | Status: AC
  Filled 2019-07-11: qty 1

## 2019-07-11 MED ORDER — CIPROFLOXACIN HCL 500 MG PO TABS: 500.0000 mg | ORAL_TABLET | ORAL | Status: AC

## 2019-07-11 MED ORDER — DIAZEPAM 5 MG PO TABS: 10.0000 mg | ORAL_TABLET | ORAL | Status: AC

## 2019-07-11 MED ORDER — DIAZEPAM 5 MG PO TABS
ORAL_TABLET | ORAL | Status: AC
  Administered 2019-07-11: 08:00:00 10 mg via ORAL
  Filled 2019-07-11: qty 2

## 2019-07-11 MED ORDER — KETOROLAC TROMETHAMINE 30 MG/ML IJ SOLN
30.0000 mg | Freq: Once | INTRAMUSCULAR | Status: AC
  Administered 2019-07-11: 30 mg via INTRAVENOUS

## 2019-07-11 MED ORDER — ONDANSETRON HCL 4 MG/2ML IJ SOLN
INTRAMUSCULAR | Status: AC
  Administered 2019-07-11: 08:00:00 4 mg via INTRAVENOUS
  Filled 2019-07-11: qty 2

## 2019-07-11 MED ORDER — KETOROLAC TROMETHAMINE 30 MG/ML IJ SOLN
INTRAMUSCULAR | Status: AC
  Filled 2019-07-11: qty 1

## 2019-07-11 MED ORDER — DIPHENHYDRAMINE HCL 25 MG PO CAPS
ORAL_CAPSULE | ORAL | Status: AC
  Administered 2019-07-11: 25 mg via ORAL
  Filled 2019-07-11: qty 1

## 2019-07-11 MED ORDER — TAMSULOSIN HCL 0.4 MG PO CAPS: 0.4000 mg | ORAL_CAPSULE | Freq: Every day | ORAL | 0 refills | Status: DC

## 2019-07-11 MED ORDER — ONDANSETRON HCL 4 MG/2ML IJ SOLN: 4.0000 mg | Freq: Once | INTRAMUSCULAR | Status: AC | PRN

## 2019-07-11 MED ORDER — DIPHENHYDRAMINE HCL 25 MG PO CAPS: 25.0000 mg | ORAL_CAPSULE | ORAL | Status: AC

## 2019-07-11 NOTE — Interval H&P Note (Signed)
History and Physical Interval Note:  07/11/2019 10:18 AM  Tracie Mcdaniel  has presented today for surgery, with the diagnosis of Kidney stone.  The various methods of treatment have been discussed with the patient and family. After consideration of risks, benefits and other options for treatment, the patient has consented to  Procedure(s): EXTRACORPOREAL SHOCK WAVE LITHOTRIPSY (ESWL) (Left) as a surgical intervention.  The patient's history has been reviewed, patient examined, no change in status, stable for surgery.  I have reviewed the patient's chart and labs.  Questions were answered to the patient's satisfaction.     Vanna Scotland

## 2019-07-11 NOTE — Discharge Instructions (Addendum)
See [facility] discharge instructions in chart.  AMBULATORY SURGERY  DISCHARGE INSTRUCTIONS   1) The drugs that you were given will stay in your system until tomorrow so for the next 24 hours you should not:  A) Drive an automobile B) Make any legal decisions C) Drink any alcoholic beverage   2) You may resume regular meals tomorrow.  Today it is better to start with liquids and gradually work up to solid foods.  You may eat anything you prefer, but it is better to start with liquids, then soup and crackers, and gradually work up to solid foods.   3) Please notify your doctor immediately if you have any unusual bleeding, trouble breathing, redness and pain at the surgery site, drainage, fever, or pain not relieved by medication.    4) Additional Instructions:        Please contact your physician with any problems or Same Day Surgery at [phone], Monday through Friday 6 am to 4 pm, or [place] [facility] [place] Main number at [phone].  

## 2019-07-12 LAB — CULTURE, URINE COMPREHENSIVE

## 2019-07-22 ENCOUNTER — Ambulatory Visit
Admission: RE | Admit: 2019-07-22 | Discharge: 2019-07-22 | Disposition: A | Discharge status: Home / Self Care | Payer: Medicare Other | Source: Ambulatory Visit | Attending: Urology | Admitting: Urology

## 2019-07-22 ENCOUNTER — Ambulatory Visit
Admission: RE | Admit: 2019-07-22 | Discharge: 2019-07-22 | Disposition: A | Discharge status: Home / Self Care | Payer: Medicare Other | Attending: Urology | Admitting: Urology

## 2019-07-22 ENCOUNTER — Ambulatory Visit (INDEPENDENT_AMBULATORY_CARE_PROVIDER_SITE_OTHER): Payer: Medicare Other | Admitting: Urology

## 2019-07-22 ENCOUNTER — Other Ambulatory Visit
Admission: RE | Admit: 2019-07-22 | Discharge: 2019-07-22 | Disposition: A | Discharge status: Home / Self Care | Payer: Medicare Other | Source: Home / Self Care | Attending: Urology | Admitting: Urology

## 2019-07-22 ENCOUNTER — Encounter: Payer: Self-pay | Admitting: Urology

## 2019-07-22 ENCOUNTER — Other Ambulatory Visit: Payer: Self-pay

## 2019-07-22 ENCOUNTER — Telehealth: Payer: Self-pay

## 2019-07-22 VITALS — BP 147/97 | HR 95 | Ht 64.0 in | Wt 154.0 lb

## 2019-07-22 DIAGNOSIS — N201 Calculus of ureter: Secondary | ICD-10-CM | POA: Diagnosis present

## 2019-07-22 DIAGNOSIS — R109 Unspecified abdominal pain: Secondary | ICD-10-CM

## 2019-07-22 DIAGNOSIS — R310 Gross hematuria: Secondary | ICD-10-CM

## 2019-07-22 LAB — URINALYSIS, COMPLETE (UACMP) WITH MICROSCOPIC
Bilirubin Urine: NEGATIVE
Glucose, UA: NEGATIVE mg/dL
Hgb urine dipstick: NEGATIVE
Ketones, ur: NEGATIVE mg/dL
Nitrite: NEGATIVE
Protein, ur: NEGATIVE mg/dL
Specific Gravity, Urine: 1.01 (ref 1.005–1.030)
pH: 6.5 (ref 5.0–8.0)

## 2019-07-22 MED ORDER — PROMETHAZINE HCL 12.5 MG PO TABS: 12.5000 mg | ORAL_TABLET | Freq: Four times a day (QID) | ORAL | 0 refills | Status: AC | PRN

## 2019-07-22 MED ORDER — OXYCODONE-ACETAMINOPHEN 5-325 MG PO TABS: 1.0000 | ORAL_TABLET | Freq: Three times a day (TID) | ORAL | 0 refills | Status: DC | PRN

## 2019-07-22 MED ORDER — TAMSULOSIN HCL 0.4 MG PO CAPS: 0.4000 mg | ORAL_CAPSULE | Freq: Every day | ORAL | 0 refills | Status: AC

## 2019-07-22 NOTE — Progress Notes (Signed)
07/22/2019 9:14 PM   Tracie Mcdaniel 1970/10/16 [id number]  Referring provider: Thea Alken, MD [id number] [facility] [address] [place],  [place] [address]  Chief Complaint  Patient presents with  . Post-op Problem    KUB    HPI: Tracie Mcdaniel is a 49 year old female with nephrolithiasis and gross hematuria who is status post ESWL on 07/11/2019 who presents today for the symptoms of burning with urination.  She underwent ESWL on July 11, 2019 for a proximal 7 mm left ureteral stone with Dr. Apolinar Junes.  She states after the procedure she had a can her left lower quadrant, but it was not bothersome.  2 days later, the pain intensified and radiated into the left flank area.  She states the pain was 10 out of 10.  She states the pain would last for long periods of time.  She states she voided out a mucous plug.  She has not captured any distinct fragments in a strainer.  Today she still has pain in the same location, but it is 5 out of 10.   Patient denies any modifying or aggravating factors.  Patient with fevers (low grade), chills, nausea or vomiting (clear liquid).   She had contacted the on-call service over the weekend and was initiated on Septra.  UA was yellow clear, specific gravity 1.010, pH 6.5, trace leukocyte, 0-5 squamous epithelial cells, 6-10 WBCs, 0-5 RBCs and rare bacteria.  KUB taken today identifies a calcification suspicious for a fragment in the left UVJ.     PMH: Past Medical History:  Diagnosis Date  . Anxiety   . Bulging lumbar disc    x3  . Emphysema of lung (HCC)   . Fibromyalgia   . Osteoarthritis   . Osteoporosis     Surgical History: Past Surgical History:  Procedure Laterality Date  . ABDOMINAL HYSTERECTOMY    . EXTRACORPOREAL SHOCK WAVE LITHOTRIPSY Left 07/11/2019   Procedure: EXTRACORPOREAL SHOCK WAVE LITHOTRIPSY (ESWL);  Surgeon: Tracie Scotland, MD;  Location: [facility] ORS;  Service: Urology;  Laterality: Left;    Home Medications:   Allergies as of 07/22/2019      Reactions   Iodinated Diagnostic Agents Anaphylaxis, Other (See Comments)   Some kind of contrast when they gave her heart cath//Reaction is : I died Some kind of contrast when they gave her heart cath//Reaction is : I died      Medication List       Accurate as of July 22, 2019 11:59 PM. If you have any questions, ask your nurse or doctor.        STOP taking these medications   cyclobenzaprine 10 MG tablet Commonly known as: FLEXERIL Stopped by: Raesha Coonrod, PA-C   meloxicam 15 MG tablet Commonly known as: MOBIC Stopped by: Michiel Cowboy, PA-C     TAKE these medications   acetaminophen-codeine 300-15 MG tablet Commonly known as: TYLENOL #2 Take 1 tablet by mouth every 4 (four) hours as needed for moderate pain.   albuterol 108 (90 Base) MCG/ACT inhaler Commonly known as: VENTOLIN HFA Inhale 2 puffs into the lungs every 6 (six) hours as needed for wheezing or shortness of breath.   albuterol (5 MG/ML) 0.5% nebulizer solution Commonly known as: PROVENTIL Take 2.5 mg by nebulization every 6 (six) hours as needed for wheezing or shortness of breath.   ALPRAZolam 1 MG tablet Commonly known as: XANAX Take 1 mg by mouth every 6 (six) hours as needed for  anxiety.   amitriptyline 100 MG tablet Commonly known as: ELAVIL Take 100 mg by mouth at bedtime.   butalbital-acetaminophen-caffeine 50-325-40 MG tablet Commonly known as: FIORICET Take 1 tablet by mouth every 6 (six) hours as needed for migraine.   diclofenac sodium 1 % Gel Commonly known as: VOLTAREN Apply 2 g topically 4 (four) times daily as needed (for pain).   Fluticasone-Salmeterol 500-50 MCG/DOSE Aepb Commonly known as: ADVAIR Inhale 1 puff into the lungs 2 (two) times daily.   gabapentin 300 MG capsule Commonly known as: NEURONTIN Take 1,200 mg by mouth 3 (three) times daily.   ipratropium 17 MCG/ACT inhaler Commonly known as: ATROVENT HFA Inhale into the  lungs.   lactulose 10 GM/15ML solution Commonly known as: CHRONULAC Take 10 g by mouth daily as needed.   methocarbamol 500 MG tablet Commonly known as: Robaxin Take 1 tablet (500 mg total) by mouth 4 (four) times daily.   omeprazole 20 MG capsule Commonly known as: PRILOSEC Take 20 mg by mouth 2 (two) times daily before a meal.   oxyCODONE-acetaminophen 5-325 MG tablet Commonly known as: Percocet Take 1 tablet by mouth every 8 (eight) hours as needed for severe pain. What changed:   when to take this  reasons to take this  Another medication with the same name was removed. Continue taking this medication, and follow the directions you see here. Changed by: Michiel Cowboy, PA-C   promethazine 12.5 MG tablet Commonly known as: PHENERGAN Take 1 tablet (12.5 mg total) by mouth every 6 (six) hours as needed for nausea or vomiting.   sertraline 100 MG tablet Commonly known as: ZOLOFT Take 250 mg by mouth daily.   sulfamethoxazole-trimethoprim 800-160 MG tablet Commonly known as: BACTRIM DS Take 1 tablet by mouth 2 (two) times daily.   tamsulosin 0.4 MG Caps capsule Commonly known as: Flomax Take 1 capsule (0.4 mg total) by mouth daily.   tiotropium 18 MCG inhalation capsule Commonly known as: SPIRIVA Place 18 mcg into inhaler and inhale daily.   topiramate 50 MG tablet Commonly known as: TOPAMAX Take 150 mg by mouth 2 (two) times daily.   triamcinolone ointment 0.5 % Commonly known as: KENALOG Apply 1 application topically 2 (two) times daily as needed (for itching).   zolpidem 10 MG tablet Commonly known as: AMBIEN Take 10 mg by mouth at bedtime as needed for sleep.       Allergies:  Allergies  Allergen Reactions  . Iodinated Diagnostic Agents Anaphylaxis and Other (See Comments)    Some kind of contrast when they gave her heart cath//Reaction is : I died Some kind of contrast when they gave her heart cath//Reaction is : I died     Family  History: Family History  Problem Relation Age of Onset  . Bladder Cancer Mother   . Uterine cancer Mother     Social History:  reports that she has been smoking cigarettes. She has been smoking about 0.50 packs per day. She has never used smokeless tobacco. She reports current alcohol use. She reports that she does not use drugs.  ROS: Pertinent ROS in HPI  Physical Exam: BP (!) 147/97   Pulse 95   Ht 5\' 4"  (1.626 m)   Wt 154 lb (69.9 kg)   BMI 26.43 kg/m   Constitutional:  Well nourished. Alert and oriented, No acute distress. HEENT: [place] AT, mask in place.  Trachea midline, no masses. Cardiovascular: No clubbing, cyanosis, or edema. Respiratory: Normal respiratory effort, no increased work  of breathing. Neurologic: Grossly intact, no focal deficits, moving all 4 extremities. Psychiatric: Normal mood and affect.  Laboratory Data: Lab Results  Component Value Date   WBC 11.9 (H) 07/05/2019   HGB 14.3 07/05/2019   HCT 42.0 07/05/2019   MCV 94.8 07/05/2019   PLT 251 07/05/2019   Lab Results  Component Value Date   CREATININE 0.84 07/05/2019   Lab Results  Component Value Date   AST 80 (H) 07/01/2019   Lab Results  Component Value Date   ALT 80 (H) 07/01/2019   Urinalysis Component     Latest Ref Rng & Units 07/22/2019  Color, Urine     YELLOW YELLOW  Appearance     CLEAR CLEAR  Specific Gravity, Urine     1.005 - 1.030 1.010  pH     5.0 - 8.0 6.5  Glucose, UA     NEGATIVE mg/dL NEGATIVE  Hgb urine dipstick     NEGATIVE NEGATIVE  Bilirubin Urine     NEGATIVE NEGATIVE  Ketones, ur     NEGATIVE mg/dL NEGATIVE  Protein     NEGATIVE mg/dL NEGATIVE  Nitrite     NEGATIVE NEGATIVE  Leukocytes,Ua     NEGATIVE TRACE (A)  RBC / HPF     0 - 5 RBC/hpf 0-5  WBC, UA     0 - 5 WBC/hpf 6-10  Bacteria, UA     NONE SEEN RARE (A)  Squamous Epithelial / LPF     0 - 5 0-5   I have reviewed the labs.   Pertinent Imaging: CLINICAL DATA:  Left kidney  stone  EXAM: ABDOMEN - 1 VIEW  COMPARISON:  07/01/2019  FINDINGS: There is a moderate amount of stool throughout the colon. There is no bowel dilatation to suggest obstruction. There is no evidence of pneumoperitoneum, portal venous gas or pneumatosis.  There are no pathologic calcifications along the expected course of the ureters.  The osseous structures are unremarkable.  IMPRESSION: No nephrolithiasis.   Electronically Signed   By: Kathreen Devoid   On: 07/09/2019 15:59 I have independently reviewed the films and identified a calculus at the left UVJ.      Assessment & Plan:    1. Left ureteral stone - s/p left ESWL - fragment appear at the left UVJ - keep follow up appointment on 07/25/2019 for repeat KUB  2. Left flank pain - secondary to left UVJ stone - given 10 tablets of Percocet 5/325 - take one tablet every 8 hours prn for pain - PDMP reviewed  - refill given on tamsulosin 0.4 mg daily Patient is advised that if they should start to experience pain that is not able to be controlled with pain medication, intractable nausea and/or vomiting and/or fevers greater than 103 or shaking chills to contact the office immediately or seek treatment in the emergency department for emergent intervention.    3. Gross hematuria - UA today demonstrates 0-5 RBC's  - continue to monitor the patient's UA after the treatment/passage of the stone to ensure the hematuria has resolved - if hematuria persists, we will pursue a hematuria workup with CT Urogram and cystoscopy if appropriate.   Return for keep appointment on Thursday .  These notes generated with voice recognition software. I apologize for typographical errors.  Zara Council, PA-C  [facility] [address]  [place], [place] [address] [phone]

## 2019-07-22 NOTE — Telephone Encounter (Signed)
Called patient today following up on after hours nurse line. She states over the weekend she was in terrible pain and passing "mucus/sludge and blood" she states she has seen no stones. She is having burning with urination today and states her flank pain is somewhat better. After hours nurse line did send in an abx and patient started yesterday. She was added on to PA schedule today for a UA/CX and KUB for further evaluation

## 2019-07-23 LAB — URINE CULTURE: Culture: <10000 — AB

## 2019-07-25 ENCOUNTER — Encounter: Payer: Self-pay | Admitting: Urology

## 2019-07-25 ENCOUNTER — Ambulatory Visit: Payer: Medicare Other | Admitting: Urology

## 2019-07-25 NOTE — Progress Notes (Incomplete)
07/25/19 12:03 AM   Tracie Mcdaniel Oct 15, 1970 [id number]  Referring provider: Thea Alken, MD [id number] [facility] [address] [place],  [place] [address]  No chief complaint on file.   HPI: Tracie Mcdaniel is a 49 y.o. F with a history of nephrolithiasis and gross hematuria returns today s/p ESWL.  She underwent ESWL on July 11, 2019 for a proximal 7 mm left ureteral stone with Dr. Apolinar Junes.  She states after the procedure she had a can her left lower quadrant, but it was not bothersome.  2 days later, the pain intensified and radiated into the left flank area.  She states the pain was 10 out of 10.  She states the pain would last for long periods of time.  She states she voided out a mucous plug.  She has not captured any distinct fragments in a strainer.    KUB from 07/22/19 indicated calcification suspicious for a fragment in the left UVJ.    1. Left ureteral stone - s/p left ESWL - fragment appear at the left UVJ - keep follow up appointment on 07/25/2019 for repeat KUB  2. Left flank pain - secondary to left UVJ stone - given 10 tablets of Percocet 5/325 - take one tablet every 8 hours prn for pain - PDMP reviewed  - refill given on tamsulosin 0.4 mg daily Patient is advised that if they should start to experience pain that is not able to be controlled with pain medication, intractable nausea and/or vomiting and/or fevers greater than 103 or shaking chills to contact the office immediately or seek treatment in the emergency department for emergent intervention.    3. Gross hematuria - UA today demonstrates 0-5 RBC's  - continue to monitor the patient's UA after the treatment/passage of the stone to ensure the hematuria has resolved - if hematuria persists, we will pursue a hematuria workup with CT Urogram and cystoscopy if appropriate.     PMH: Past Medical History:  Diagnosis Date  . Anxiety   . Bulging lumbar disc    x3  . Emphysema of lung (HCC)   .  Fibromyalgia   . Osteoarthritis   . Osteoporosis     Surgical History: Past Surgical History:  Procedure Laterality Date  . ABDOMINAL HYSTERECTOMY    . EXTRACORPOREAL SHOCK WAVE LITHOTRIPSY Left 07/11/2019   Procedure: EXTRACORPOREAL SHOCK WAVE LITHOTRIPSY (ESWL);  Surgeon: Vanna Scotland, MD;  Location: [facility] ORS;  Service: Urology;  Laterality: Left;    Home Medications:  Allergies as of 07/25/2019      Reactions   Iodinated Diagnostic Agents Anaphylaxis, Other (See Comments)   Some kind of contrast when they gave her heart cath//Reaction is : I died Some kind of contrast when they gave her heart cath//Reaction is : I died      Medication List       Accurate as of July 25, 2019 12:03 AM. If you have any questions, ask your nurse or doctor.        acetaminophen-codeine 300-15 MG tablet Commonly known as: TYLENOL #2 Take 1 tablet by mouth every 4 (four) hours as needed for moderate pain.   albuterol 108 (90 Base) MCG/ACT inhaler Commonly known as: VENTOLIN HFA Inhale 2 puffs into the lungs every 6 (six) hours as needed for wheezing or shortness of breath.   albuterol (5 MG/ML) 0.5% nebulizer solution Commonly known as: PROVENTIL Take 2.5 mg by nebulization every 6 (six) hours as needed for wheezing or  shortness of breath.   ALPRAZolam 1 MG tablet Commonly known as: XANAX Take 1 mg by mouth every 6 (six) hours as needed for anxiety.   amitriptyline 100 MG tablet Commonly known as: ELAVIL Take 100 mg by mouth at bedtime.   butalbital-acetaminophen-caffeine 50-325-40 MG tablet Commonly known as: FIORICET Take 1 tablet by mouth every 6 (six) hours as needed for migraine.   diclofenac sodium 1 % Gel Commonly known as: VOLTAREN Apply 2 g topically 4 (four) times daily as needed (for pain).   Fluticasone-Salmeterol 500-50 MCG/DOSE Aepb Commonly known as: ADVAIR Inhale 1 puff into the lungs 2 (two) times daily.   gabapentin 300 MG capsule Commonly known as:  NEURONTIN Take 1,200 mg by mouth 3 (three) times daily.   ipratropium 17 MCG/ACT inhaler Commonly known as: ATROVENT HFA Inhale into the lungs.   lactulose 10 GM/15ML solution Commonly known as: CHRONULAC Take 10 g by mouth daily as needed.   methocarbamol 500 MG tablet Commonly known as: Robaxin Take 1 tablet (500 mg total) by mouth 4 (four) times daily.   omeprazole 20 MG capsule Commonly known as: PRILOSEC Take 20 mg by mouth 2 (two) times daily before a meal.   oxyCODONE-acetaminophen 5-325 MG tablet Commonly known as: Percocet Take 1 tablet by mouth every 8 (eight) hours as needed for severe pain.   promethazine 12.5 MG tablet Commonly known as: PHENERGAN Take 1 tablet (12.5 mg total) by mouth every 6 (six) hours as needed for nausea or vomiting.   sertraline 100 MG tablet Commonly known as: ZOLOFT Take 250 mg by mouth daily.   sulfamethoxazole-trimethoprim 800-160 MG tablet Commonly known as: BACTRIM DS Take 1 tablet by mouth 2 (two) times daily.   tamsulosin 0.4 MG Caps capsule Commonly known as: Flomax Take 1 capsule (0.4 mg total) by mouth daily.   tiotropium 18 MCG inhalation capsule Commonly known as: SPIRIVA Place 18 mcg into inhaler and inhale daily.   topiramate 50 MG tablet Commonly known as: TOPAMAX Take 150 mg by mouth 2 (two) times daily.   triamcinolone ointment 0.5 % Commonly known as: KENALOG Apply 1 application topically 2 (two) times daily as needed (for itching).   zolpidem 10 MG tablet Commonly known as: AMBIEN Take 10 mg by mouth at bedtime as needed for sleep.       Allergies:  Allergies  Allergen Reactions  . Iodinated Diagnostic Agents Anaphylaxis and Other (See Comments)    Some kind of contrast when they gave her heart cath//Reaction is : I died Some kind of contrast when they gave her heart cath//Reaction is : I died     Family History: Family History  Problem Relation Age of Onset  . Bladder Cancer Mother   .  Uterine cancer Mother     Social History:  reports that she has been smoking cigarettes. She has been smoking about 0.50 packs per day. She has never used smokeless tobacco. She reports current alcohol use. She reports that she does not use drugs.   Physical Exam: There were no vitals taken for this visit.  Constitutional:  Alert and oriented, No acute distress. HEENT: [place] AT, moist mucus membranes.  Trachea midline, no masses. Cardiovascular: No clubbing, cyanosis, or edema. Respiratory: Normal respiratory effort, no increased work of breathing. GI: Abdomen is soft, nontender, nondistended, no abdominal masses GU: No CVA tenderness Lymph: No cervical or inguinal lymphadenopathy. Skin: No rashes, bruises or suspicious lesions. Neurologic: Grossly intact, no focal deficits, moving all 4 extremities. Psychiatric:  Normal mood and affect.  Laboratory Data: Lab Results  Component Value Date   WBC 11.9 (H) 07/05/2019   HGB 14.3 07/05/2019   HCT 42.0 07/05/2019   MCV 94.8 07/05/2019   PLT 251 07/05/2019    Lab Results  Component Value Date   CREATININE 0.84 07/05/2019    Urinalysis    Component Value Date/Time   COLORURINE YELLOW 07/22/2019 0956   APPEARANCEUR CLEAR 07/22/2019 0956   APPEARANCEUR Clear 07/09/2019 0918   LABSPEC 1.010 07/22/2019 0956   PHURINE 6.5 07/22/2019 0956   GLUCOSEU NEGATIVE 07/22/2019 0956   HGBUR NEGATIVE 07/22/2019 0956   BILIRUBINUR NEGATIVE 07/22/2019 0956   BILIRUBINUR Negative 07/09/2019 0918   KETONESUR NEGATIVE 07/22/2019 0956   PROTEINUR NEGATIVE 07/22/2019 0956   NITRITE NEGATIVE 07/22/2019 0956   LEUKOCYTESUR TRACE (A) 07/22/2019 0956    Lab Results  Component Value Date   LABMICR See below: 07/09/2019   WBCUA 0-5 07/09/2019   LABEPIT 0-10 07/09/2019   BACTERIA RARE (A) 07/22/2019    Pertinent Imaging:   Assessment & Plan:    @DIAGMED @  No follow-ups on file.  [facility] [address], [address] [place], [place] [phone]  I, [phone], am acting as a [place] for Neurosurgeon,  {Add Scribe Attestation Statement}

## 2019-11-03 ENCOUNTER — Emergency Department: Payer: Medicare Other

## 2019-11-03 ENCOUNTER — Emergency Department
Admission: EM | Admit: 2019-11-03 | Discharge: 2019-11-03 | Disposition: A | Discharge status: Home / Self Care | Payer: Medicare Other | Attending: Emergency Medicine | Admitting: Emergency Medicine

## 2019-11-03 ENCOUNTER — Other Ambulatory Visit: Payer: Self-pay

## 2019-11-03 ENCOUNTER — Encounter: Payer: Self-pay | Admitting: Emergency Medicine

## 2019-11-03 DIAGNOSIS — R109 Unspecified abdominal pain: Secondary | ICD-10-CM | POA: Insufficient documentation

## 2019-11-03 DIAGNOSIS — R110 Nausea: Secondary | ICD-10-CM | POA: Insufficient documentation

## 2019-11-03 DIAGNOSIS — M549 Dorsalgia, unspecified: Secondary | ICD-10-CM | POA: Diagnosis not present

## 2019-11-03 DIAGNOSIS — Z5321 Procedure and treatment not carried out due to patient leaving prior to being seen by health care provider: Secondary | ICD-10-CM | POA: Diagnosis not present

## 2019-11-03 LAB — CBC
HCT: 40.3 % (ref 36.0–46.0)
Hemoglobin: 14 g/dL (ref 12.0–15.0)
MCH: 32 pg (ref 26.0–34.0)
MCHC: 34.7 g/dL (ref 30.0–36.0)
MCV: 92 fL (ref 80.0–100.0)
Platelets: 252 10*3/uL (ref 150–400)
RBC: 4.38 MIL/uL (ref 3.87–5.11)
RDW: 12.2 % (ref 11.5–15.5)
WBC: 8.4 10*3/uL (ref 4.0–10.5)
nRBC: 0 % (ref 0.0–0.2)

## 2019-11-03 LAB — BASIC METABOLIC PANEL
Anion gap: 11 (ref 5–15)
BUN: 14 mg/dL (ref 6–20)
CO2: 27 mmol/L (ref 22–32)
Calcium: 8.7 mg/dL — ABNORMAL LOW (ref 8.9–10.3)
Chloride: 100 mmol/L (ref 98–111)
Creatinine, Ser: 0.81 mg/dL (ref 0.44–1.00)
GFR calc Af Amer: >60 mL/min (ref >60)
GFR calc non Af Amer: >60 mL/min (ref >60)
Glucose, Bld: 96 mg/dL (ref 70–99)
Potassium: 3.8 mmol/L (ref 3.5–5.1)
Sodium: 138 mmol/L (ref 135–145)

## 2019-11-03 NOTE — ED Triage Notes (Signed)
Pt presents to ED via POV with c/o R side and back pain. Pt states hx of kidney stones. Pt also c/o nausea, deneis vomiting at this time.   Pt states intense pain is intermittent.   Pt with chronic 2L via [place].

## 2019-11-03 NOTE — ED Notes (Signed)
This RN notified by Lorin Picket, EDT that patient stated she was leaving. Pt not visualized in wheelchair she was previously sitting in.

## 2020-05-12 ENCOUNTER — Emergency Department: Payer: Medicare Other

## 2020-05-12 ENCOUNTER — Emergency Department
Admission: EM | Admit: 2020-05-12 | Discharge: 2020-05-12 | Disposition: A | Discharge status: Home / Self Care | Payer: Medicare Other | Attending: Student in an Organized Health Care Education/Training Program | Admitting: Student in an Organized Health Care Education/Training Program

## 2020-05-12 ENCOUNTER — Other Ambulatory Visit: Payer: Self-pay

## 2020-05-12 DIAGNOSIS — R079 Chest pain, unspecified: Secondary | ICD-10-CM | POA: Insufficient documentation

## 2020-05-12 DIAGNOSIS — Z5321 Procedure and treatment not carried out due to patient leaving prior to being seen by health care provider: Secondary | ICD-10-CM | POA: Diagnosis not present

## 2020-05-12 DIAGNOSIS — R1012 Left upper quadrant pain: Secondary | ICD-10-CM | POA: Diagnosis not present

## 2020-05-12 LAB — CBC
HCT: 45.5 % (ref 36.0–46.0)
Hemoglobin: 15.5 g/dL — ABNORMAL HIGH (ref 12.0–15.0)
MCH: 31.8 pg (ref 26.0–34.0)
MCHC: 34.1 g/dL (ref 30.0–36.0)
MCV: 93.2 fL (ref 80.0–100.0)
Platelets: 269 10*3/uL (ref 150–400)
RBC: 4.88 MIL/uL (ref 3.87–5.11)
RDW: 12.5 % (ref 11.5–15.5)
WBC: 6.3 10*3/uL (ref 4.0–10.5)
nRBC: 0 % (ref 0.0–0.2)

## 2020-05-12 LAB — BASIC METABOLIC PANEL
Anion gap: 10 (ref 5–15)
BUN: 10 mg/dL (ref 6–20)
CO2: 28 mmol/L (ref 22–32)
Calcium: 9.4 mg/dL (ref 8.9–10.3)
Chloride: 101 mmol/L (ref 98–111)
Creatinine, Ser: 0.75 mg/dL (ref 0.44–1.00)
GFR, Estimated: >60 mL/min (ref >60)
Glucose, Bld: 102 mg/dL — ABNORMAL HIGH (ref 70–99)
Potassium: 4.4 mmol/L (ref 3.5–5.1)
Sodium: 139 mmol/L (ref 135–145)

## 2020-05-12 LAB — TROPONIN I (HIGH SENSITIVITY): Troponin I (High Sensitivity): 5 ng/L (ref <18)

## 2020-05-12 NOTE — ED Triage Notes (Signed)
Pt c/o LUQ/chest pain that radiates around to the back since Saturday, pt is on home O2 at 2L [place]. Pt is ambulatory with a steady gait and no distress noted.

## 2021-05-13 ENCOUNTER — Other Ambulatory Visit: Payer: Self-pay

## 2021-05-13 ENCOUNTER — Emergency Department: Payer: Medicare Other

## 2021-05-13 ENCOUNTER — Emergency Department
Admission: EM | Admit: 2021-05-13 | Discharge: 2021-05-13 | Disposition: A | Discharge status: Home / Self Care | Payer: Medicare Other | Attending: Emergency Medicine | Admitting: Emergency Medicine

## 2021-05-13 DIAGNOSIS — I10 Essential (primary) hypertension: Secondary | ICD-10-CM | POA: Insufficient documentation

## 2021-05-13 DIAGNOSIS — J449 Chronic obstructive pulmonary disease, unspecified: Secondary | ICD-10-CM | POA: Diagnosis not present

## 2021-05-13 DIAGNOSIS — R0789 Other chest pain: Secondary | ICD-10-CM | POA: Diagnosis not present

## 2021-05-13 DIAGNOSIS — Z5321 Procedure and treatment not carried out due to patient leaving prior to being seen by health care provider: Secondary | ICD-10-CM | POA: Diagnosis not present

## 2021-05-13 LAB — TROPONIN I (HIGH SENSITIVITY): Troponin I (High Sensitivity): <2 ng/L (ref <18)

## 2021-05-13 LAB — CBC
HCT: 43.1 % (ref 36.0–46.0)
Hemoglobin: 14.8 g/dL (ref 12.0–15.0)
MCH: 32.1 pg (ref 26.0–34.0)
MCHC: 34.3 g/dL (ref 30.0–36.0)
MCV: 93.5 fL (ref 80.0–100.0)
Platelets: 260 10*3/uL (ref 150–400)
RBC: 4.61 MIL/uL (ref 3.87–5.11)
RDW: 12.5 % (ref 11.5–15.5)
WBC: 9.2 10*3/uL (ref 4.0–10.5)
nRBC: 0 % (ref 0.0–0.2)

## 2021-05-13 LAB — BASIC METABOLIC PANEL
Anion gap: 9 (ref 5–15)
BUN: 17 mg/dL (ref 6–20)
CO2: 24 mmol/L (ref 22–32)
Calcium: 9.1 mg/dL (ref 8.9–10.3)
Chloride: 104 mmol/L (ref 98–111)
Creatinine, Ser: 0.99 mg/dL (ref 0.44–1.00)
GFR, Estimated: >60 mL/min (ref >60)
Glucose, Bld: 129 mg/dL — ABNORMAL HIGH (ref 70–99)
Potassium: 4 mmol/L (ref 3.5–5.1)
Sodium: 137 mmol/L (ref 135–145)

## 2021-05-13 NOTE — ED Triage Notes (Signed)
Pt comes into the ED via EMS from home with c/o right sided chest pain/pressure since yesterday, hx of COPD/HTN,   180/110 HR90's 100% 2L [place] all the time

## 2023-10-18 ENCOUNTER — Emergency Department

## 2023-10-18 ENCOUNTER — Emergency Department
Admission: EM | Admit: 2023-10-18 | Discharge: 2023-10-19 | Disposition: A | Discharge status: Home / Self Care | Attending: Emergency Medicine | Admitting: Emergency Medicine

## 2023-10-18 ENCOUNTER — Other Ambulatory Visit: Payer: Self-pay

## 2023-10-18 DIAGNOSIS — R300 Dysuria: Secondary | ICD-10-CM | POA: Insufficient documentation

## 2023-10-18 DIAGNOSIS — I700 Atherosclerosis of aorta: Secondary | ICD-10-CM | POA: Diagnosis not present

## 2023-10-18 DIAGNOSIS — M79652 Pain in left thigh: Secondary | ICD-10-CM | POA: Insufficient documentation

## 2023-10-18 DIAGNOSIS — K449 Diaphragmatic hernia without obstruction or gangrene: Secondary | ICD-10-CM | POA: Diagnosis not present

## 2023-10-18 DIAGNOSIS — R350 Frequency of micturition: Secondary | ICD-10-CM | POA: Diagnosis not present

## 2023-10-18 DIAGNOSIS — E876 Hypokalemia: Secondary | ICD-10-CM | POA: Diagnosis not present

## 2023-10-18 DIAGNOSIS — M545 Low back pain, unspecified: Secondary | ICD-10-CM | POA: Diagnosis present

## 2023-10-18 LAB — BASIC METABOLIC PANEL WITH GFR
Anion gap: 13 (ref 5–15)
BUN: 13 mg/dL (ref 6–20)
CO2: 25 mmol/L (ref 22–32)
Calcium: 8.7 mg/dL — ABNORMAL LOW (ref 8.9–10.3)
Chloride: 98 mmol/L (ref 98–111)
Creatinine, Ser: 0.82 mg/dL (ref 0.44–1.00)
GFR, Estimated: >60 mL/min (ref >60)
Glucose, Bld: 151 mg/dL — ABNORMAL HIGH (ref 70–99)
Potassium: 2.8 mmol/L — ABNORMAL LOW (ref 3.5–5.1)
Sodium: 136 mmol/L (ref 135–145)

## 2023-10-18 LAB — CBC WITH DIFFERENTIAL/PLATELET
Abs Immature Granulocytes: 0.03 10*3/uL (ref 0.00–0.07)
Basophils Absolute: 0.1 10*3/uL (ref 0.0–0.1)
Basophils Relative: 1 %
Eosinophils Absolute: 0.3 10*3/uL (ref 0.0–0.5)
Eosinophils Relative: 4 %
HCT: 40.1 % (ref 36.0–46.0)
Hemoglobin: 14 g/dL (ref 12.0–15.0)
Immature Granulocytes: 0 %
Lymphocytes Relative: 43 %
Lymphs Abs: 3.7 10*3/uL (ref 0.7–4.0)
MCH: 32.2 pg (ref 26.0–34.0)
MCHC: 34.9 g/dL (ref 30.0–36.0)
MCV: 92.2 fL (ref 80.0–100.0)
Monocytes Absolute: 0.5 10*3/uL (ref 0.1–1.0)
Monocytes Relative: 6 %
Neutro Abs: 4.1 10*3/uL (ref 1.7–7.7)
Neutrophils Relative %: 46 %
Platelets: 280 10*3/uL (ref 150–400)
RBC: 4.35 MIL/uL (ref 3.87–5.11)
RDW: 12.4 % (ref 11.5–15.5)
WBC: 8.6 10*3/uL (ref 4.0–10.5)
nRBC: 0 % (ref 0.0–0.2)

## 2023-10-18 LAB — POC URINE PREG, ED: Preg Test, Ur: NEGATIVE

## 2023-10-18 LAB — URINALYSIS, ROUTINE W REFLEX MICROSCOPIC
Bilirubin Urine: NEGATIVE
Glucose, UA: NEGATIVE mg/dL
Hgb urine dipstick: NEGATIVE
Ketones, ur: NEGATIVE mg/dL
Leukocytes,Ua: NEGATIVE
Nitrite: NEGATIVE
Protein, ur: NEGATIVE mg/dL
Specific Gravity, Urine: 1.015 (ref 1.005–1.030)
pH: 5 (ref 5.0–8.0)

## 2023-10-18 MED ORDER — CYCLOBENZAPRINE HCL 10 MG PO TABS
10.0000 mg | ORAL_TABLET | Freq: Once | ORAL | Status: AC
  Administered 2023-10-18: 10 mg via ORAL
  Filled 2023-10-18: qty 1

## 2023-10-18 MED ORDER — KETOROLAC TROMETHAMINE 15 MG/ML IJ SOLN
15.0000 mg | Freq: Once | INTRAMUSCULAR | Status: AC
  Administered 2023-10-18: 15 mg via INTRAMUSCULAR
  Filled 2023-10-18: qty 1

## 2023-10-18 MED ORDER — OXYCODONE-ACETAMINOPHEN 5-325 MG PO TABS
1.0000 | ORAL_TABLET | Freq: Once | ORAL | Status: AC
  Administered 2023-10-18: 1 via ORAL
  Filled 2023-10-18: qty 1

## 2023-10-18 NOTE — ED Provider Notes (Signed)
 [facility] Provider Note    Event Date/Time   First MD Initiated Contact with Patient 10/18/23 2302     (approximate)   History   Flank Pain   HPI  Tracie Mcdaniel is a 53 y.o. female with a history of kidney stones who presents with left lower back pain, acute onset yesterday, persistent course since then, nonradiating, but associated with some milder pain in her left thigh.  She denies any weakness or numbness in the left leg.  She has no incontinence.  She does report some dysuria and frequency.  She was concerned she might have a kidney stone.  She denies any vomiting or diarrhea.  She has no abdominal pain.  I reviewed the past medical records.  The patient's most recent outpatient encounter was with internal medicine on 5/23 for flulike symptoms, fatigue, and multiple complaints.  She has no recent hospitalizations.   Physical Exam   Triage Vital Signs: ED Triage Vitals  Encounter Vitals Group     BP 10/18/23 2132 108/81     Girls Systolic BP Percentile --      Girls Diastolic BP Percentile --      Boys Systolic BP Percentile --      Boys Diastolic BP Percentile --      Pulse Rate 10/18/23 2132 94     Resp 10/18/23 2132 18     Temp 10/18/23 2132 98.5 F (36.9 C)     Temp src --      SpO2 10/18/23 2132 97 %     Weight 10/18/23 2131 149 lb (67.6 kg)     Height 10/18/23 2131 5' 4 (1.626 m)     Head Circumference --      Peak Flow --      Pain Score 10/18/23 2131 8     Pain Loc --      Pain Education --      Exclude from Growth Chart --     Most recent vital signs: Vitals:   10/18/23 2132  BP: 108/81  Pulse: 94  Resp: 18  Temp: 98.5 F (36.9 C)  SpO2: 97%     General: Awake, no distress.  CV:  Good peripheral perfusion.  Resp:  Normal effort.  Abd:  No distention.  Other:  5/5 motor strength and intact sensation to the left lower extremity.  No midline spinal tenderness.  Mild left lumbar paraspinal tenderness.  No rashes or  skin lesions.   ED Results / Procedures / Treatments   Labs (all labs ordered are listed, but only abnormal results are displayed) Labs Reviewed  BASIC METABOLIC PANEL WITH GFR - Abnormal; Notable for the following components:      Result Value   Potassium 2.8 (*)    Glucose, Bld 151 (*)    Calcium 8.7 (*)    All other components within normal limits  URINALYSIS, ROUTINE W REFLEX MICROSCOPIC - Abnormal; Notable for the following components:   Color, Urine YELLOW (*)    APPearance CLEAR (*)    All other components within normal limits  CBC WITH DIFFERENTIAL/PLATELET  POC URINE PREG, ED     EKG     RADIOLOGY  CT renal stone study: Pending independently viewed and interpreted the images; there is no visible ureteral stone.  Radiology report indicates the following.  IMPRESSION:  1. No obstructing renal stones or hydronephrosis.  2. Small hiatal hernia.  3. Aortic atherosclerosis (ICD10-I70.0).    PROCEDURES:  Critical Care  performed: No  Procedures   MEDICATIONS ORDERED IN ED: Medications - No data to display   IMPRESSION / MDM / ASSESSMENT AND PLAN / ED COURSE  I reviewed the triage vital signs and the nursing notes.  53 year old female with PMH as noted above presents with left lower back pain acute onset yesterday.  On exam her vital signs are normal and she is mildly tender to the left lumbar paraspinal region with no  concerning exam findings.  Differential diagnosis includes, but is not limited to, sciatica, muscle spasm, radiculopathy, other musculoskeletal back pain, ureteral stone, UTI/pyelonephritis.  There is no rash or evidence of shingles.  Urinalysis is negative.  CT renal stone study is also negative.  BMP is significant for mild hypokalemia.  There is no leukocytosis or anemia.  There is no indication for further ED workup.  We will treat symptomatically and reassess.  Patient's presentation is most consistent with acute complicated illness /  injury requiring diagnostic workup.     FINAL CLINICAL IMPRESSION(S) / ED DIAGNOSES   Final diagnoses:  None     Rx / DC Orders   ED Discharge Orders     None        Note:  This document was prepared using Dragon voice recognition software and may include unintentional dictation errors.

## 2023-10-18 NOTE — ED Triage Notes (Signed)
 Pt reports left side flank pain that began last night. Pt states she has also had inc in urine frequency. Hx kidney stones.

## 2023-10-19 MED ORDER — OXYCODONE-ACETAMINOPHEN 5-325 MG PO TABS: 1.0000 | ORAL_TABLET | Freq: Four times a day (QID) | ORAL | 0 refills | Status: AC | PRN

## 2023-10-19 MED ORDER — POTASSIUM CHLORIDE CRYS ER 20 MEQ PO TBCR: 20.0000 meq | EXTENDED_RELEASE_TABLET | Freq: Two times a day (BID) | ORAL | 0 refills | Status: AC

## 2023-10-19 MED ORDER — CYCLOBENZAPRINE HCL 10 MG PO TABS: 10.0000 mg | ORAL_TABLET | Freq: Three times a day (TID) | ORAL | 0 refills | Status: AC | PRN

## 2023-10-19 MED ORDER — IBUPROFEN 600 MG PO TABS: 600.0000 mg | ORAL_TABLET | Freq: Four times a day (QID) | ORAL | 0 refills | Status: AC | PRN

## 2023-10-19 NOTE — Discharge Instructions (Addendum)
 Take the potassium twice daily as prescribed.  Take the ibuprofen as the baseline pain medication every 6 hours with food.  You may add on the Flexeril  and Percocet as needed over the next few days.  DO NOT MIX the Percocet with the Tylenol  with codeine that you already have.  Take the Percocet as needed and switch back to the Tylenol  with codeine over the next few days once your symptoms improve.  Return to the ER for new, worsening, or persistent severe pain, any weakness or numbness in the leg, incontinence, or any other new or worsening symptoms that concern you.
